# Patient Record
Sex: Female | Born: 1950 | Race: White | Hispanic: No | Marital: Married | Smoking: Former smoker
Health system: Southern US, Community
[De-identification: ages and names within clinical notes are randomized; demographics above are authoritative.]

## PROBLEM LIST (undated history)

## (undated) DIAGNOSIS — E785 Hyperlipidemia, unspecified: Secondary | ICD-10-CM

## (undated) DIAGNOSIS — E669 Obesity, unspecified: Secondary | ICD-10-CM

## (undated) DIAGNOSIS — Z720 Tobacco use: Secondary | ICD-10-CM

## (undated) DIAGNOSIS — I10 Essential (primary) hypertension: Secondary | ICD-10-CM

## (undated) DIAGNOSIS — R739 Hyperglycemia, unspecified: Secondary | ICD-10-CM

## (undated) DIAGNOSIS — I2510 Atherosclerotic heart disease of native coronary artery without angina pectoris: Secondary | ICD-10-CM

## (undated) DIAGNOSIS — I2111 ST elevation (STEMI) myocardial infarction involving right coronary artery: Secondary | ICD-10-CM

## (undated) HISTORY — DX: Tobacco use: Z72.0

## (undated) HISTORY — DX: Obesity, unspecified: E66.9

## (undated) HISTORY — DX: Hyperlipidemia, unspecified: E78.5

## (undated) HISTORY — DX: Hyperglycemia, unspecified: R73.9

## (undated) HISTORY — PX: ABDOMINAL HYSTERECTOMY: SHX81

## (undated) HISTORY — PX: HEMORROIDECTOMY: SUR656

## (undated) HISTORY — DX: Essential (primary) hypertension: I10

## (undated) HISTORY — DX: Atherosclerotic heart disease of native coronary artery without angina pectoris: I25.10

---

## 2003-06-21 ENCOUNTER — Emergency Department (HOSPITAL_COMMUNITY): Admission: AD | Admit: 2003-06-21 | Discharge: 2003-06-21 | Payer: Self-pay | Admitting: Family Medicine

## 2003-11-19 ENCOUNTER — Emergency Department (HOSPITAL_COMMUNITY): Admission: EM | Admit: 2003-11-19 | Discharge: 2003-11-19 | Payer: Self-pay | Admitting: *Deleted

## 2004-04-16 ENCOUNTER — Emergency Department (HOSPITAL_COMMUNITY): Admission: EM | Admit: 2004-04-16 | Discharge: 2004-04-16 | Payer: Self-pay | Admitting: *Deleted

## 2004-05-29 ENCOUNTER — Emergency Department (HOSPITAL_COMMUNITY): Admission: EM | Admit: 2004-05-29 | Discharge: 2004-05-29 | Payer: Self-pay | Admitting: Emergency Medicine

## 2007-08-14 ENCOUNTER — Emergency Department (HOSPITAL_COMMUNITY): Admission: EM | Admit: 2007-08-14 | Discharge: 2007-08-14 | Payer: Self-pay | Admitting: Family Medicine

## 2007-08-18 ENCOUNTER — Inpatient Hospital Stay (HOSPITAL_COMMUNITY): Admission: EM | Admit: 2007-08-18 | Discharge: 2007-08-20 | Payer: Self-pay | Admitting: Emergency Medicine

## 2011-01-19 NOTE — H&P (Signed)
Leslie Dorsey               ACCOUNT NO.:  [id number]   MEDICAL RECORD NO.:  [id number]          PATIENT TYPE:  INP   LOCATION:  6712                         FACILITY:  [facility]   PHYSICIAN:  Mobolaji B. Bakare, M.D.DATE OF BIRTH:  Sep 17, 1950   DATE OF ADMISSION:  08/18/2007  DATE OF DISCHARGE:  08/20/2007                              HISTORY & PHYSICAL   PRIMARY CARE PHYSICIAN:  Unassigned.   CHIEF COMPLAINT:  Headaches and elevated blood pressure.   HISTORY OF PRESENTING COMPLAINT:  Leslie Dorsey is a 60 year old Caucasian  female with no known significant past medical history.  She is not on  any chronic medications.  She started having headaches about 3 days ago,  which were intermittent and hemicrania.  It alternates between left and  right side.  Currently, the headache is on the right from her forehead  back to her occipital region with pain behind her eyes and she rates the  pain in severity as a 14/10.  She described it like a distant thunder.  It is aggravated by movements, lights, and sounds.  When she gets this  headache, she does not want to do anything.  She just wants to lay  still.  Last night, the patient could not bear the headache, she had to  lay still on the floor in the bathroom where her husband found her.  It  has been associated with nausea and dry heaving but no vomiting.  She  did not describe any aura.  The patient states that she has been told in  the distant past that she had migraine headaches, but she is not on any  regular treatment for this.  The patient was seen at the urgent care on August 14, 2007, for right  hip pain which was felt to be arthritis.  She was prescribed some pain  medication.  This symptom has subsided.  During that visit, she had a  blood pressure checked.  It was 197/102.  She was instructed to follow  up with a recheck in a week.  The patient went to her eye doctor today  to get her eyeglasses.  She requested to have her  blood pressure checked  and it was 197/117.  Then she was told to go to the urgent care.  She  was seen at [facility].  Her blood pressure was 205/122.  The patient was  then sent to the hospital.  She has no known history of hypertension.  Currently, the patient's blood pressure is 156/96, after treatment with  clonidine 0.1 mg x2 doses.  She has also received 4 mg of morphine IV  and 4 mg of Zofran.  The patient describes diarrhea episodes this morning.  She had 2  episodes of diarrheal stool associated with some abdominal discomfort  but no vomiting, no hematochezia.  This has since subsided.  She denies  fever or chills.  She has no dysuria or urgency.   REVIEW OF SYSTEMS:  She denies chest pain, shortness of breath,  abdominal pain, no fever or chills, no cough, no orthopnea or PND.   PAST  MEDICAL HISTORY:  History of migraine headaches.   PAST SURGICAL HISTORY:  1. Hysterectomy.  2. Hemorrhoidectomy.   FAMILY HISTORY:  Significant for diabetes mellitus, hypertension, heart  disease in her Mom.  Father comitted suicide.  The patient has other  siblings, nine in total.   MEDICATIONS:  None.   ALLERGIES:  CODEINE causes headache.   SOCIAL HISTORY:  The patient works as a Conservation officer, nature in [employer].  She  smokes cigarettes and she has been smoking an average of 1 pack per day  since the age of 9.  She is keen on quitting smoking.  She denies  alcohol use/abuse and illicit drug abuse.   PHYSICAL EXAMINATION:  VITAL SIGNS:  Initial temperature 97.9, blood  pressure 197/106, pulse 75, respiratory rate of 20, O2 sat of 97%.  Her  current blood pressure is 156/96 with a heart rate of 76.  GENERAL:  The patient is comfortable.  She has her eyes closed most of  the time.  She is not in acute respiratory distress.  HEENT:  Normocephalic atraumatic head.  I did not evaluate the pupils in  view of sensitivity to light.  Mucous membranes moist.  No oral thrush.  NECK:  No elevated JVD.  No  thyromegaly, carotid bruit.  There is no  neck stiffness.  LUNGS:  Left basal rales.  CV:  S1 S2 regular.  No murmur.  ABDOMEN:  Not distended.  Soft.  There is mild epigastric tenderness  without rebound or guarding.  There is no tenderness in the lower  abdomen.  Bowel sounds present.  No palpable organomegaly.  EXTREMITIES:  No pedal edema or calf tenderness. Dorsalis pedis pulses  are 2 plus bilaterally.  CNS:  Muscle power 5/5 in all limbs.  Coordination is intact, although  the patient could not properly open her eyes but there was no past-  pointing.   INITIAL LABORATORY DATA:   Dictation ended at this point.      Mobolaji B. Corky Downs, M.D.  Electronically Signed     MBB/MEDQ  D:  08/18/2007  T:  08/20/2007  Job:  [id number]

## 2011-01-19 NOTE — H&P (Signed)
NAME:  Leslie Dorsey, Leslie Dorsey NO.:  [id number]   MEDICAL RECORD NO.:  [id number]          PATIENT TYPE:  EMS   LOCATION:  MAJO                         FACILITY:  [facility]   PHYSICIAN:  Mobolaji B. Bakare, M.D.DATE OF BIRTH:  1950-12-18   DATE OF ADMISSION:  08/18/2007  DATE OF DISCHARGE:                              HISTORY & PHYSICAL   This is a continuation of dictation on this patient that was  interrupted.   LABORATORY DATA:  Creatinine 0.8.  Venous pH 7.32, venous pCO2 55.5.  Sodium 139, potassium 3.4, chloride 106, glucose 85, BUN 12.  White  cells 9.3, hemoglobin 14.5, hematocrit 42.5, platelets 359.  Normal  differential.  Urinalysis:  Cloudy in appearance with specific gravity  of 1.028, ketones 15, small leukocytes.  Microscopy, white cells 0-2,  amorphous urates and granular casts.   EKG shows a normal sinus rhythm without ST segment elevation.  No LVH on  EKG.  Normal intervals.  Heart rate of 62.   Chest x-ray shows no acute cardiopulmonary disease.   Head CT scan showed no intracranial abnormality.   ASSESSMENT/PLAN:  Leslie Dorsey is a 60 year old Caucasian female  presenting with headaches and hypertension.  She has a history of  migraine headache in the remote past.  She described these headaches,  being precipitated sometimes by emotional stress.   ADMISSION DIAGNOSES:  1. Migraine headaches:  Will start on Vicodin 1-2 p.o. q.4h. p.r.n.      Metoclopramide (Reglan) 10 mg IV q.6h. around the clock for 24      hours, then p.r.n.  Will avoid trypotan in this setting with      uncontrolled hypertension.  Will give Dilaudid 0.5 to 1 mg IV q.4h.      p.r.n. severe pain.  2. Hypertension:  Seemingly, this is a new diagnosis for the patient,      although she has had pain associated with high blood pressure;      however, I believe she would benefit from starting antihypertensive      and pain solely would not account for the degree of high blood  pressure.  Will start on beta blocker, atenolol 25 mg p.o. daily,      and add hydrochlorothiazide 12.5 mg p.o. daily.  Would give      labetalol  10 mg IV q.4h. p.r.n. for diastolic blood pressure      greater than 105 and systolic greater than 200.  Will check a      fasting lipid profile and hemoglobin A1C.  She will be placed on a      low salt diet.  3. Tobacco abuse:  Patient is quitting smoking.  Will offer tobacco      cessation counseling and place on nicotine patch 21 mg daily.  4. Diarrhea:  This may be sympathetic-derived.  Nevertheless, will      send stool for culture, C. diff, and fecal leukocytes.  Will      continue IV normal saline at 100 cc/hr.  5. Hypokalemia:  Will replete with potassium chloride 40 mEq x1 and  also replete IV fluids.      Mobolaji B. Corky Downs, M.D.  Electronically Signed     MBB/MEDQ  D:  08/18/2007  T:  08/18/2007  Job:  [id number]

## 2011-01-19 NOTE — Discharge Summary (Signed)
NAMEJUNI, GLAAB               ACCOUNT NO.:  [id number]   MEDICAL RECORD NO.:  [id number]          PATIENT TYPE:  INP   LOCATION:  6712                         FACILITY:  [facility]   PHYSICIAN:  Mobolaji B. Bakare, M.D.DATE OF BIRTH:  [birth year]   DATE OF ADMISSION:  08/18/2007  DATE OF DISCHARGE:  08/20/2007                               DISCHARGE SUMMARY   PRIMARY CARE PHYSICIAN:  [facility].   FINAL DIAGNOSES:  1. Migraine headaches.  2. Tobacco abuse.  3. Uncontrolled hypertension.  4. Transient diarrhea, resolved.  5. Hypercholesterolemia.   PROCEDURES:  1. Ultrasound of the abdomen was normal.  There was no pancreatitis.  2. Head CT scan showed no acute intracranial abnormality.  3. Chest x-ray shows no acute cardiopulmonary process.   BRIEF HISTORY:  Please refer to the admission H&P dictated by Dr.  Corky Downs.  In brief, Mrs. Saville presented with unilateral hemicranial  headaches associated with nausea, aggravated by noise, activities, and  lights.  She had uncontrolled blood pressure at the time of admission,  with systolic in the range of 200.  She responded to IV labetalol in the  emergency room.  The patient has a history of migraine headaches  previously, and recently in the last 2 weeks she has had high blood  pressure, but she did not seek medical attention for treatment.  She was  admitted for further treatment and evaluation.   HOSPITAL COURSE:  1. Migraine headaches.  She was started on metoclopramide for nausea      and Vicodin for mild to moderate headaches.  She responded well to      this treatment.  The patient was given a prescription for Midrin to      use p.r.n. at onset of migraine headaches.  2. Hypertension.  Blood pressure was controlled prior to discharge      with atenolol 25 mg daily.  Moreso, as the patient has migraine      headaches, this can be used as a preventive medication.  Blood      pressure at the time of discharge was  128/82.  She was on a      combination of atenolol at hydrochlorothiazide.  3. Tobacco abuse.  She was using 1 pack per day, and she has been      smoking since the age of 17.  The patient was counseled on tobacco      cessation, and she is willing to quit.  She will be required to      have outpatient tobacco cessation counseling and continue nicotine      patch at home.  4. Hypercholesterolemia.  The patient had a fasting lipid profile with      results showing normal HDL of 45, total cholesterol 235, LDL      cholesterol was 63, triglycerides 94.  She was advised on low-      cholesterol diet, and she should have a follow-up fasting lipid      profile in 3 months.  5. Diarrhea.  The patient had a transient diarrhea episode on the day  of admission.  There was no recurrence during this hospitalization.      A lipase was checked, which was initially abnormal at 364, but the      patient did not have any significant abdominal pain.  Follow-up      lipase the next day was 37, with amylase of 77.  Ultrasound of the      abdomen was not suggestive of pancreatitis, and there was no      abnormality on abdominal ultrasound.   DISCHARGE MEDICATIONS:  1. Atenolol 25 mg daily.  2. Hydrochlorothiazide 12.5 mg daily.  3. Nicotine patch 21 mg daily.  4. Midrin to use p.r.n.   RECOMMENDATIONS:  Repeat fasting lipid profile in 3 months.   DIET:  Low-cholesterol diet, low-salt diet.   DISCHARGE LABORATORY DATA:  White cells 8.1, hemoglobin 12.2, hematocrit  36, platelets 288.  Sodium 139, potassium 3.6, chloride 103, CO2 27,  glucose 78, BUN 11, creatinine 0.81.  AST 16, ALT 13, total protein 5.8,  albumin 3.3, TSH 1.979.  Hemoglobin A1c 5.7.  Fasting lipid profile as  mentioned in the HPI.  Magnesium 2.      Mobolaji B. Corky Downs, M.D.  Electronically Signed     MBB/MEDQ  D:  08/20/2007  T:  08/21/2007  Job:  [id number]

## 2011-06-14 LAB — COMPREHENSIVE METABOLIC PANEL
ALT: 13
Alkaline Phosphatase: 73
CO2: 27
Chloride: 103
GFR calc non Af Amer: >60
Glucose, Bld: 78
Potassium: 3.6
Sodium: 139
Total Bilirubin: 0.9

## 2011-06-14 LAB — CBC
HCT: 36
HCT: 42.5
Hemoglobin: 12.2
Hemoglobin: 14.4
MCHC: 33.8
MCHC: 33.9
MCV: 91.8
MCV: 91.8
Platelets: 288
Platelets: 359
RBC: 3.92
RBC: 4.63
RDW: 12
RDW: 12.4
WBC: 8.1
WBC: 9.3

## 2011-06-14 LAB — LIPID PANEL
Cholesterol: 231 — ABNORMAL HIGH
LDL Cholesterol: 167 — ABNORMAL HIGH
Triglycerides: 94

## 2011-06-14 LAB — BASIC METABOLIC PANEL
BUN: 11
CO2: 25
Calcium: 8.8
Chloride: 108
Creatinine, Ser: 0.78
GFR calc Af Amer: >60
GFR calc non Af Amer: >60
Glucose, Bld: 93
Potassium: 4.2
Sodium: 138

## 2011-06-14 LAB — HEPATIC FUNCTION PANEL
ALT: 14
AST: 15
Albumin: 3.5
Alkaline Phosphatase: 71
Bilirubin, Direct: <0.1
Total Bilirubin: 0.8
Total Protein: 5.8 — ABNORMAL LOW

## 2011-06-14 LAB — DIFFERENTIAL
Basophils Absolute: 0.1
Basophils Relative: 1
Eosinophils Absolute: 0.1 — ABNORMAL LOW
Eosinophils Relative: 1
Lymphocytes Relative: 31
Lymphs Abs: 2.8
Monocytes Absolute: 0.5
Monocytes Relative: 5
Neutro Abs: 5.8
Neutrophils Relative %: 62

## 2011-06-14 LAB — URINALYSIS, ROUTINE W REFLEX MICROSCOPIC
Glucose, UA: NEGATIVE
Hgb urine dipstick: NEGATIVE
Ketones, ur: 15 — AB
Nitrite: NEGATIVE
Protein, ur: NEGATIVE
Specific Gravity, Urine: 1.028
Urobilinogen, UA: 0.2
pH: 5

## 2011-06-14 LAB — URINE MICROSCOPIC-ADD ON

## 2011-06-14 LAB — LIPASE, BLOOD
Lipase: 364 — ABNORMAL HIGH
Lipase: 37

## 2011-06-14 LAB — HEMOGLOBIN A1C: Hgb A1c MFr Bld: 5.7

## 2011-06-14 LAB — AMYLASE: Amylase: 77

## 2011-06-14 LAB — I-STAT 8, (EC8 V) (CONVERTED LAB)
Acid-Base Excess: 1
BUN: 12
Bicarbonate: 28.7 — ABNORMAL HIGH
Chloride: 106
Glucose, Bld: 85
HCT: 46
Hemoglobin: 15.6 — ABNORMAL HIGH
Operator id: 284141
Potassium: 3.4 — ABNORMAL LOW
Sodium: 139
TCO2: 30
pCO2, Ven: 55.5 — ABNORMAL HIGH
pH, Ven: 7.321 — ABNORMAL HIGH

## 2011-06-14 LAB — POCT CARDIAC MARKERS
CKMB, poc: <1 — ABNORMAL LOW
Troponin i, poc: <0.05

## 2011-06-14 LAB — TSH: TSH: 1.979

## 2011-06-14 LAB — PHOSPHORUS: Phosphorus: 3.8

## 2014-03-14 ENCOUNTER — Emergency Department (HOSPITAL_BASED_OUTPATIENT_CLINIC_OR_DEPARTMENT_OTHER): Payer: 59

## 2014-03-14 ENCOUNTER — Encounter (HOSPITAL_BASED_OUTPATIENT_CLINIC_OR_DEPARTMENT_OTHER): Payer: Self-pay | Admitting: Emergency Medicine

## 2014-03-14 ENCOUNTER — Emergency Department (HOSPITAL_BASED_OUTPATIENT_CLINIC_OR_DEPARTMENT_OTHER)
Admission: EM | Admit: 2014-03-14 | Discharge: 2014-03-14 | Disposition: A | Discharge status: Home / Self Care | Payer: 59 | Attending: Emergency Medicine | Admitting: Emergency Medicine

## 2014-03-14 DIAGNOSIS — I10 Essential (primary) hypertension: Secondary | ICD-10-CM | POA: Insufficient documentation

## 2014-03-14 DIAGNOSIS — R0602 Shortness of breath: Secondary | ICD-10-CM | POA: Insufficient documentation

## 2014-03-14 DIAGNOSIS — F17200 Nicotine dependence, unspecified, uncomplicated: Secondary | ICD-10-CM | POA: Insufficient documentation

## 2014-03-14 LAB — BASIC METABOLIC PANEL
ANION GAP: 12 (ref 5–15)
BUN: 14 mg/dL (ref 6–23)
CALCIUM: 9.4 mg/dL (ref 8.4–10.5)
CHLORIDE: 103 meq/L (ref 96–112)
CO2: 28 meq/L (ref 19–32)
CREATININE: 0.8 mg/dL (ref 0.50–1.10)
GFR calc Af Amer: 88 mL/min — ABNORMAL LOW (ref >90)
GFR calc non Af Amer: 76 mL/min — ABNORMAL LOW (ref >90)
GLUCOSE: 97 mg/dL (ref 70–99)
Potassium: 3.4 mEq/L — ABNORMAL LOW (ref 3.7–5.3)
Sodium: 143 mEq/L (ref 137–147)

## 2014-03-14 LAB — CBC
HCT: 39.9 % (ref 36.0–46.0)
HEMOGLOBIN: 13.3 g/dL (ref 12.0–15.0)
MCH: 30.9 pg (ref 26.0–34.0)
MCHC: 33.3 g/dL (ref 30.0–36.0)
MCV: 92.6 fL (ref 78.0–100.0)
Platelets: 297 10*3/uL (ref 150–400)
RBC: 4.31 MIL/uL (ref 3.87–5.11)
RDW: 12.8 % (ref 11.5–15.5)
WBC: 7.4 10*3/uL (ref 4.0–10.5)

## 2014-03-14 LAB — TROPONIN I

## 2014-03-14 LAB — PRO B NATRIURETIC PEPTIDE: Pro B Natriuretic peptide (BNP): 87.6 pg/mL (ref 0–125)

## 2014-03-14 MED ORDER — LISINOPRIL 10 MG PO TABS: 10.0000 mg | ORAL_TABLET | Freq: Every day | ORAL | Status: DC

## 2014-03-14 MED ORDER — LABETALOL HCL 5 MG/ML IV SOLN
20.0000 mg | Freq: Once | INTRAVENOUS | Status: AC
  Administered 2014-03-14: 20 mg via INTRAVENOUS
  Filled 2014-03-14: qty 4

## 2014-03-14 NOTE — ED Provider Notes (Signed)
CSN: [id number]     Arrival date & time 03/14/14  1238 History   First MD Initiated Contact with Patient 03/14/14 1343     Chief Complaint  Patient presents with  . Hypertension     (Consider location/radiation/quality/duration/timing/severity/associated sxs/prior Treatment) HPI Comments: Patient presents to the ER for evaluation of elevated blood pressure. Patient went to the eye doctor today and was told her blood pressure was very elevated. She was 190s over 100 100s. Patient does endorse frequent headaches over the last month. She reports that she often wakes up with a headache, but it completely goes away after she takes a BC powder.   Patient denies chest pain. She does report that she has been noticing some decreased exercise tolerance, gets out of breath if she walks a distance, but does not get chest pain with exertion.  Patient is a 63 y.o. female presenting with hypertension.  Hypertension Associated symptoms include headaches and shortness of breath. Pertinent negatives include no chest pain.    No past medical history on file. Past Surgical History  Procedure Laterality Date  . Abdominal hysterectomy    . Hemorroidectomy     No family history on file. History  Substance Use Topics  . Smoking status: Current Every Day Smoker  . Smokeless tobacco: Not on file  . Alcohol Use: Not on file   OB History   Grav Para Term Preterm Abortions TAB SAB Ect Mult Living                 Review of Systems  Respiratory: Positive for shortness of breath.   Cardiovascular: Negative for chest pain.  Neurological: Positive for headaches.  All other systems reviewed and are negative.     Allergies  Morphine and related  Home Medications   Prior to Admission medications   Not on File   BP 191/84  Pulse 74  Resp 18  SpO2 98% Physical Exam  Constitutional: She is oriented to person, place, and time. She appears well-developed and well-nourished. No distress.  HENT:   Head: Normocephalic and atraumatic.  Right Ear: Hearing normal.  Left Ear: Hearing normal.  Nose: Nose normal.  Mouth/Throat: Oropharynx is clear and moist and mucous membranes are normal.  Eyes: Conjunctivae and EOM are normal. Pupils are equal, round, and reactive to light.  Neck: Normal range of motion. Neck supple.  Cardiovascular: Regular rhythm, S1 normal and S2 normal.  Exam reveals no gallop and no friction rub.   No murmur heard. Pulmonary/Chest: Effort normal and breath sounds normal. No respiratory distress. She exhibits no tenderness.  Abdominal: Soft. Normal appearance and bowel sounds are normal. There is no hepatosplenomegaly. There is no tenderness. There is no rebound, no guarding, no tenderness at McBurney's point and negative Murphy's sign. No hernia.  Musculoskeletal: Normal range of motion.  Neurological: She is alert and oriented to person, place, and time. She has normal strength. No cranial nerve deficit or sensory deficit. Coordination normal. GCS eye subscore is 4. GCS verbal subscore is 5. GCS motor subscore is 6.  Skin: Skin is warm, dry and intact. No rash noted. No cyanosis.  Psychiatric: She has a normal mood and affect. Her speech is normal and behavior is normal. Thought content normal.    ED Course  Procedures (including critical care time) Labs Review Labs Reviewed  BASIC METABOLIC PANEL - Abnormal; Notable for the following:    Potassium 3.4 (*)    GFR calc non Af Amer 76 (*)  GFR calc Af Amer 88 (*)    All other components within normal limits  CBC  TROPONIN I  PRO B NATRIURETIC PEPTIDE    Imaging Review Dg Chest 2 View  03/14/2014   CLINICAL DATA:  Short of breath  EXAM: CHEST  2 VIEW  COMPARISON:  Radiograph 08/18/2007  FINDINGS: Normal mediastinum and cardiac silhouette. Chronic central bronchitic markings. Normal pulmonary vasculature. No effusion, infiltrate, or pneumothorax.  IMPRESSION: Chronic bronchitic markings.  No acute findings.    Electronically Signed   By: Genevive Bi M.D.   On: 03/14/2014 14:46     EKG Interpretation   Date/Time:  Thursday March 14 2014 14:12:03 EDT Ventricular Rate:  74 PR Interval:  144 QRS Duration: 84 QT Interval:  396 QTC Calculation: 439 R Axis:   47 Text Interpretation:  Normal sinus rhythm Nonspecific ST and T wave  abnormality Abnormal ECG No significant change since last tracing  Confirmed by POLLINA  MD, CHRISTOPHER [phone]) on 03/14/2014 2:48:48 PM      MDM   Final diagnoses:  None   hypertension  Patient presents to the ER for evaluation of elevated blood pressure. Patient is essentially asymptomatic. She has been experiencing headaches for a month, but they go away with over-the-counter analgesics and headache is not currently present. She has a normal neurologic exam.  Basic workup was unremarkable. Included a cardiac workup because she reports that she has some shortness of breath with exertion at times. She has no evidence of congestive heart failure. EKG unremarkable. Troponin negative. Patient was hypertensive here, she was administered labetalol with some mild improvement. Patient will be initiated on lisinopril. She reports that she has a doctor at [facility] that she can call for followup.    Gilda Crease, MD 03/14/14 [phone]

## 2014-03-14 NOTE — Discharge Instructions (Signed)
Hypertension Hypertension, commonly called high blood pressure, is when the force of blood pumping through your arteries is too strong. Your arteries are the blood vessels that carry blood from your heart throughout your body. A blood pressure reading consists of a higher number over a lower number, such as 110/72. The higher number (systolic) is the pressure inside your arteries when your heart pumps. The lower number (diastolic) is the pressure inside your arteries when your heart relaxes. Ideally you want your blood pressure below 120/80. Hypertension forces your heart to work harder to pump blood. Your arteries may become narrow or stiff. Having hypertension puts you at risk for heart disease, stroke, and other problems.  RISK FACTORS Some risk factors for high blood pressure are controllable. Others are not.  Risk factors you cannot control include:   Race. You may be at higher risk if you are African American.  Age. Risk increases with age.  Gender. Men are at higher risk than women before age 45 years. After age 65, women are at higher risk than men. Risk factors you can control include:  Not getting enough exercise or physical activity.  Being overweight.  Getting too much fat, sugar, calories, or salt in your diet.  Drinking too much alcohol. SIGNS AND SYMPTOMS Hypertension does not usually cause signs or symptoms. Extremely high blood pressure (hypertensive crisis) may cause headache, anxiety, shortness of breath, and nosebleed. DIAGNOSIS  To check if you have hypertension, your health care provider will measure your blood pressure while you are seated, with your arm held at the level of your heart. It should be measured at least twice using the same arm. Certain conditions can cause a difference in blood pressure between your right and left arms. A blood pressure reading that is higher than normal on one occasion does not mean that you need treatment. If one blood pressure reading  is high, ask your health care provider about having it checked again. TREATMENT  Treating high blood pressure includes making lifestyle changes and possibly taking medication. Living a healthy lifestyle can help lower high blood pressure. You may need to change some of your habits. Lifestyle changes may include:  Following the DASH diet. This diet is high in fruits, vegetables, and whole grains. It is low in salt, red meat, and added sugars.  Getting at least 2 1/2 hours of brisk physical activity every week.  Losing weight if necessary.  Not smoking.  Limiting alcoholic beverages.  Learning ways to reduce stress. If lifestyle changes are not enough to get your blood pressure under control, your health care provider may prescribe medicine. You may need to take more than one. Work closely with your health care provider to understand the risks and benefits. HOME CARE INSTRUCTIONS  Have your blood pressure rechecked as directed by your health care provider.   Only take medicine as directed by your health care provider. Follow the directions carefully. Blood pressure medicines must be taken as prescribed. The medicine does not work as well when you skip doses. Skipping doses also puts you at risk for problems.   Do not smoke.   Monitor your blood pressure at home as directed by your health care provider. SEEK MEDICAL CARE IF:   You think you are having a reaction to medicines taken.  You have recurrent headaches or feel dizzy.  You have swelling in your ankles.  You have trouble with your vision. SEEK IMMEDIATE MEDICAL CARE IF:  You develop a severe headache or   confusion.  You have unusual weakness, numbness, or feel faint.  You have severe chest or abdominal pain.  You vomit repeatedly.  You have trouble breathing. MAKE SURE YOU:   Understand these instructions.  Will watch your condition.  Will get help right away if you are not doing well or get  worse. Document Released: 08/23/2005 Document Revised: 08/28/2013 Document Reviewed: 06/15/2013 Exit[employer] Patient Information 2015 [employer]. This information is not intended to replace advice given to you by your health care provider. Make sure you discuss any questions you have with your health care provider.  

## 2014-03-14 NOTE — ED Notes (Signed)
Pt was seen at eye doctor.  She was told to get her BP checked.  Some headaches for one month.

## 2014-08-06 ENCOUNTER — Emergency Department (HOSPITAL_BASED_OUTPATIENT_CLINIC_OR_DEPARTMENT_OTHER)
Admission: EM | Admit: 2014-08-06 | Discharge: 2014-08-06 | Discharge status: Against Medical Advice | Payer: 59 | Attending: Emergency Medicine | Admitting: Emergency Medicine

## 2014-08-06 ENCOUNTER — Encounter (HOSPITAL_BASED_OUTPATIENT_CLINIC_OR_DEPARTMENT_OTHER): Payer: Self-pay | Admitting: Emergency Medicine

## 2014-08-06 DIAGNOSIS — Z79899 Other long term (current) drug therapy: Secondary | ICD-10-CM | POA: Diagnosis not present

## 2014-08-06 DIAGNOSIS — T7840XA Allergy, unspecified, initial encounter: Secondary | ICD-10-CM

## 2014-08-06 DIAGNOSIS — Z720 Tobacco use: Secondary | ICD-10-CM | POA: Insufficient documentation

## 2014-08-06 DIAGNOSIS — L500 Allergic urticaria: Secondary | ICD-10-CM | POA: Diagnosis not present

## 2014-08-06 DIAGNOSIS — R05 Cough: Secondary | ICD-10-CM | POA: Diagnosis not present

## 2014-08-06 DIAGNOSIS — I10 Essential (primary) hypertension: Secondary | ICD-10-CM | POA: Diagnosis not present

## 2014-08-06 DIAGNOSIS — T368X5A Adverse effect of other systemic antibiotics, initial encounter: Secondary | ICD-10-CM | POA: Diagnosis not present

## 2014-08-06 DIAGNOSIS — R21 Rash and other nonspecific skin eruption: Secondary | ICD-10-CM | POA: Diagnosis present

## 2014-08-06 MED ORDER — FAMOTIDINE 20 MG PO TABS
20.0000 mg | ORAL_TABLET | Freq: Once | ORAL | Status: AC
  Administered 2014-08-06: 20 mg via ORAL
  Filled 2014-08-06: qty 1

## 2014-08-06 MED ORDER — DIPHENHYDRAMINE HCL 50 MG/ML IJ SOLN
25.0000 mg | Freq: Once | INTRAMUSCULAR | Status: AC
  Administered 2014-08-06: 25 mg via INTRAMUSCULAR
  Filled 2014-08-06: qty 1

## 2014-08-06 MED ORDER — PREDNISONE 50 MG PO TABS
60.0000 mg | ORAL_TABLET | Freq: Once | ORAL | Status: AC
  Administered 2014-08-06: 60 mg via ORAL
  Filled 2014-08-06 (×2): qty 1

## 2014-08-06 NOTE — ED Notes (Signed)
Pt having possible reaction to Levofloxacin.  Pt having numbness in mouth, and some hives with itching and pain.

## 2014-08-06 NOTE — ED Notes (Signed)
Pt states she wants to go home. Pt wants to walk around nurses station, "can't stay in room". Instructed pt she needs to stay in room d/t other pts privacy. Pt states to tell MD she wants to go home.

## 2014-08-06 NOTE — ED Notes (Signed)
Pt left in no distress per Mick SellKaren P. RN.

## 2014-08-06 NOTE — ED Notes (Signed)
Pt observed leaving without MD doing final discharge instructions.

## 2014-08-06 NOTE — ED Provider Notes (Signed)
CSN: [id number]     Arrival date & time 08/06/14  0911 History   First MD Initiated Contact with Patient 08/06/14 1005     Chief Complaint  Patient presents with  . Allergic Reaction     (Consider location/radiation/quality/duration/timing/severity/associated sxs/prior Treatment) Patient is a 63 y.o. female presenting with allergic reaction.  Allergic Reaction Presenting symptoms: itching and rash   Rash:    Location:  Full body   Quality: itchiness and painful     Onset quality:  Gradual   Duration:  12 hours   Timing:  Constant   Progression:  Worsening Severity:  Severe Prior allergic episodes:  No prior episodes Context comment:  Taking levofloxacin Relieved by:  Nothing Exacerbated by: scratching. Ineffective treatments: children's benadryl.   Past Medical History  Diagnosis Date  . Hypertension    Past Surgical History  Procedure Laterality Date  . Abdominal hysterectomy    . Hemorroidectomy     No family history on file. History  Substance Use Topics  . Smoking status: Current Every Day Smoker  . Smokeless tobacco: Not on file  . Alcohol Use: Not on file   OB History    No data available     Review of Systems  Respiratory: Positive for cough.   Skin: Positive for itching and rash.  All other systems reviewed and are negative.     Allergies  Morphine and related and Levofloxacin  Home Medications   Prior to Admission medications   Medication Sig Start Date End Date Taking? Authorizing Provider  amLODipine (NORVASC) 5 MG tablet Take 5 mg by mouth daily.   Yes Historical Provider, MD  losartan (COZAAR) 50 MG tablet Take 50 mg by mouth daily.   Yes Historical Provider, MD   BP 180/118 mmHg  Pulse 85  Temp(Src) 98.2 F (36.8 C) (Oral)  Resp 16  Wt 184 lb (83.462 kg)  SpO2 99% Physical Exam  Constitutional: She is oriented to person, place, and time. She appears well-developed and well-nourished. No distress.  Pacing room, trying not to  scratch skin  HENT:  Head: Normocephalic and atraumatic.  Mouth/Throat: Oropharynx is clear and moist and mucous membranes are normal. No posterior oropharyngeal edema.  No angioedema  Eyes: Conjunctivae are normal. Pupils are equal, round, and reactive to light. No scleral icterus.  Neck: Neck supple.  Cardiovascular: Normal rate, regular rhythm, normal heart sounds and intact distal pulses.   No murmur heard. Pulmonary/Chest: Effort normal and breath sounds normal. No stridor. No respiratory distress. She has no wheezes. She has no rales.  Abdominal: Soft. Bowel sounds are normal. She exhibits no distension. There is no tenderness.  Musculoskeletal: Normal range of motion.  Neurological: She is alert and oriented to person, place, and time.  Skin: Skin is warm and dry. Rash noted. Rash is urticarial (mild, scattered).  Psychiatric: She has a normal mood and affect. Her behavior is normal.  Nursing note and vitals reviewed.   ED Course  Procedures (including critical care time) Labs Review Labs Reviewed - No data to display  Imaging Review No results found.   EKG Interpretation None      MDM   Final diagnoses:  Allergic reaction, initial encounter    63 year old female with allergic reaction symptoms in the context of taking levofloxacin.  Symptoms appear to be limited to skin hives and itchiness. No syncope, wheezing, abdominal pain. Plan treatment with Benadryl, prednisone, Pepcid.  Pt eloped from the ED prior to re-evaluation (including blood pressure  recheck).  She did not have evidence of anaphylaxis.  During our initial conversation, I told her she would need scheduled benadryl for several days.   Warnell Foresterrey Fin Hupp, MD 08/06/14 [phone]

## 2014-08-06 NOTE — ED Notes (Signed)
Pt states rash seems worse after meds given. Rash noted above umbilicus. No resp difficulty. Dr. Loretha StaplerWofford aware.

## 2014-08-13 ENCOUNTER — Ambulatory Visit
Admission: RE | Admit: 2014-08-13 | Discharge: 2014-08-13 | Disposition: A | Discharge status: Home / Self Care | Payer: 59 | Source: Ambulatory Visit | Attending: Family Medicine | Admitting: Family Medicine

## 2014-08-13 ENCOUNTER — Other Ambulatory Visit: Payer: Self-pay | Admitting: Family Medicine

## 2014-08-13 DIAGNOSIS — R05 Cough: Secondary | ICD-10-CM

## 2014-08-13 DIAGNOSIS — R059 Cough, unspecified: Secondary | ICD-10-CM

## 2015-01-14 ENCOUNTER — Ambulatory Visit
Admission: RE | Admit: 2015-01-14 | Discharge: 2015-01-14 | Disposition: A | Discharge status: Home / Self Care | Payer: 59 | Source: Ambulatory Visit | Attending: Family Medicine | Admitting: Family Medicine

## 2015-01-14 ENCOUNTER — Other Ambulatory Visit: Payer: Self-pay | Admitting: Family Medicine

## 2015-01-14 DIAGNOSIS — R0781 Pleurodynia: Secondary | ICD-10-CM

## 2015-08-26 ENCOUNTER — Inpatient Hospital Stay (HOSPITAL_COMMUNITY): Payer: 59

## 2015-08-26 ENCOUNTER — Inpatient Hospital Stay (HOSPITAL_COMMUNITY)
Admission: RE | Admit: 2015-08-26 | Discharge: 2015-08-28 | DRG: 247 | Disposition: A | Discharge status: Home / Self Care | Payer: 59 | Source: Ambulatory Visit | Attending: Cardiovascular Disease | Admitting: Cardiovascular Disease

## 2015-08-26 ENCOUNTER — Encounter (HOSPITAL_COMMUNITY): Payer: Self-pay | Admitting: Cardiology

## 2015-08-26 ENCOUNTER — Encounter (HOSPITAL_COMMUNITY)
Admission: RE | Disposition: A | Discharge status: Home / Self Care | Payer: 59 | Source: Ambulatory Visit | Attending: Cardiovascular Disease

## 2015-08-26 DIAGNOSIS — E785 Hyperlipidemia, unspecified: Secondary | ICD-10-CM | POA: Diagnosis present

## 2015-08-26 DIAGNOSIS — R0789 Other chest pain: Secondary | ICD-10-CM | POA: Diagnosis present

## 2015-08-26 DIAGNOSIS — I2510 Atherosclerotic heart disease of native coronary artery without angina pectoris: Secondary | ICD-10-CM

## 2015-08-26 DIAGNOSIS — Z87891 Personal history of nicotine dependence: Secondary | ICD-10-CM | POA: Diagnosis present

## 2015-08-26 DIAGNOSIS — R7303 Prediabetes: Secondary | ICD-10-CM | POA: Diagnosis present

## 2015-08-26 DIAGNOSIS — I213 ST elevation (STEMI) myocardial infarction of unspecified site: Secondary | ICD-10-CM | POA: Diagnosis present

## 2015-08-26 DIAGNOSIS — I10 Essential (primary) hypertension: Secondary | ICD-10-CM | POA: Diagnosis not present

## 2015-08-26 DIAGNOSIS — F419 Anxiety disorder, unspecified: Secondary | ICD-10-CM | POA: Diagnosis present

## 2015-08-26 DIAGNOSIS — I119 Hypertensive heart disease without heart failure: Secondary | ICD-10-CM | POA: Diagnosis present

## 2015-08-26 DIAGNOSIS — Z720 Tobacco use: Secondary | ICD-10-CM

## 2015-08-26 DIAGNOSIS — I959 Hypotension, unspecified: Secondary | ICD-10-CM | POA: Diagnosis present

## 2015-08-26 DIAGNOSIS — I2111 ST elevation (STEMI) myocardial infarction involving right coronary artery: Secondary | ICD-10-CM | POA: Diagnosis present

## 2015-08-26 DIAGNOSIS — I2119 ST elevation (STEMI) myocardial infarction involving other coronary artery of inferior wall: Secondary | ICD-10-CM | POA: Diagnosis present

## 2015-08-26 DIAGNOSIS — R079 Chest pain, unspecified: Secondary | ICD-10-CM | POA: Diagnosis not present

## 2015-08-26 DIAGNOSIS — I9589 Other hypotension: Secondary | ICD-10-CM | POA: Diagnosis not present

## 2015-08-26 HISTORY — DX: ST elevation (STEMI) myocardial infarction involving right coronary artery: I21.11

## 2015-08-26 HISTORY — PX: CARDIAC CATHETERIZATION: SHX172

## 2015-08-26 LAB — COMPREHENSIVE METABOLIC PANEL
ALBUMIN: 3.7 g/dL (ref 3.5–5.0)
ALK PHOS: 81 U/L (ref 38–126)
ALK PHOS: 85 U/L (ref 38–126)
ALT: 15 U/L (ref 14–54)
ALT: 16 U/L (ref 14–54)
ANION GAP: 9 (ref 5–15)
AST: 18 U/L (ref 15–41)
AST: 28 U/L (ref 15–41)
Albumin: 3.4 g/dL — ABNORMAL LOW (ref 3.5–5.0)
Anion gap: 10 (ref 5–15)
BILIRUBIN TOTAL: 0.2 mg/dL — AB (ref 0.3–1.2)
BUN: 13 mg/dL (ref 6–20)
BUN: 12 mg/dL (ref 6–20)
CALCIUM: 8.8 mg/dL — AB (ref 8.9–10.3)
CALCIUM: 8.8 mg/dL — AB (ref 8.9–10.3)
CO2: 25 mmol/L (ref 22–32)
CO2: 28 mmol/L (ref 22–32)
CREATININE: 0.87 mg/dL (ref 0.44–1.00)
CREATININE: 0.83 mg/dL (ref 0.44–1.00)
Chloride: 105 mmol/L (ref 101–111)
Chloride: 103 mmol/L (ref 101–111)
GFR calc Af Amer: >60 mL/min (ref >60)
GFR calc non Af Amer: >60 mL/min (ref >60)
GLUCOSE: 109 mg/dL — AB (ref 65–99)
Glucose, Bld: 137 mg/dL — ABNORMAL HIGH (ref 65–99)
Potassium: 3.1 mmol/L — ABNORMAL LOW (ref 3.5–5.1)
Potassium: 3.9 mmol/L (ref 3.5–5.1)
SODIUM: 140 mmol/L (ref 135–145)
Sodium: 140 mmol/L (ref 135–145)
TOTAL PROTEIN: 5.9 g/dL — AB (ref 6.5–8.1)
Total Bilirubin: 0.5 mg/dL (ref 0.3–1.2)
Total Protein: 6.5 g/dL (ref 6.5–8.1)

## 2015-08-26 LAB — CBC WITH DIFFERENTIAL/PLATELET
BASOS PCT: 0 %
Basophils Absolute: 0 10*3/uL (ref 0.0–0.1)
Eosinophils Absolute: 0.1 10*3/uL (ref 0.0–0.7)
Eosinophils Relative: 1 %
HEMATOCRIT: 41.3 % (ref 36.0–46.0)
HEMOGLOBIN: 14 g/dL (ref 12.0–15.0)
Lymphocytes Relative: 11 %
Lymphs Abs: 1.7 10*3/uL (ref 0.7–4.0)
MCH: 31.2 pg (ref 26.0–34.0)
MCHC: 33.9 g/dL (ref 30.0–36.0)
MCV: 92 fL (ref 78.0–100.0)
MONOS PCT: 3 %
Monocytes Absolute: 0.4 10*3/uL (ref 0.1–1.0)
NEUTROS ABS: 13.1 10*3/uL — AB (ref 1.7–7.7)
NEUTROS PCT: 85 %
Platelets: 275 10*3/uL (ref 150–400)
RBC: 4.49 MIL/uL (ref 3.87–5.11)
RDW: 13 % (ref 11.5–15.5)
WBC: 15.3 10*3/uL — AB (ref 4.0–10.5)

## 2015-08-26 LAB — CBC
HEMATOCRIT: 39.4 % (ref 36.0–46.0)
Hemoglobin: 13.1 g/dL (ref 12.0–15.0)
MCH: 30.5 pg (ref 26.0–34.0)
MCHC: 33.2 g/dL (ref 30.0–36.0)
MCV: 91.8 fL (ref 78.0–100.0)
PLATELETS: 273 10*3/uL (ref 150–400)
RBC: 4.29 MIL/uL (ref 3.87–5.11)
RDW: 12.9 % (ref 11.5–15.5)
WBC: 10.5 10*3/uL (ref 4.0–10.5)

## 2015-08-26 LAB — CK TOTAL AND CKMB (NOT AT ARMC)
CK, MB: 1.8 ng/mL (ref 0.5–5.0)
RELATIVE INDEX: INVALID (ref 0.0–2.5)
Total CK: 55 U/L (ref 38–234)

## 2015-08-26 LAB — TROPONIN I
TROPONIN I: 0.09 ng/mL — AB (ref <0.031)
TROPONIN I: 0.67 ng/mL — AB (ref <0.031)

## 2015-08-26 LAB — MAGNESIUM: Magnesium: 2 mg/dL (ref 1.7–2.4)

## 2015-08-26 LAB — LIPID PANEL
Cholesterol: 230 mg/dL — ABNORMAL HIGH (ref 0–200)
HDL: 48 mg/dL (ref >40)
LDL Cholesterol: 142 mg/dL — ABNORMAL HIGH (ref 0–99)
TRIGLYCERIDES: 201 mg/dL — AB (ref <150)
Total CHOL/HDL Ratio: 4.8 RATIO
VLDL: 40 mg/dL (ref 0–40)

## 2015-08-26 LAB — BRAIN NATRIURETIC PEPTIDE: B Natriuretic Peptide: 51.1 pg/mL (ref 0.0–100.0)

## 2015-08-26 LAB — MRSA PCR SCREENING: MRSA by PCR: NEGATIVE

## 2015-08-26 LAB — APTT: APTT: 23 s — AB (ref 24–37)

## 2015-08-26 LAB — PROTIME-INR
INR: 0.96 (ref 0.00–1.49)
Prothrombin Time: 13 seconds (ref 11.6–15.2)

## 2015-08-26 LAB — POCT ACTIVATED CLOTTING TIME: ACTIVATED CLOTTING TIME: 430 s

## 2015-08-26 LAB — TSH: TSH: 2.157 u[IU]/mL (ref 0.350–4.500)

## 2015-08-26 LAB — T4, FREE: FREE T4: 0.94 ng/dL (ref 0.61–1.12)

## 2015-08-26 SURGERY — LEFT HEART CATH AND CORONARY ANGIOGRAPHY
Anesthesia: LOCAL

## 2015-08-26 MED ORDER — ASPIRIN EC 81 MG PO TBEC: 81.0000 mg | DELAYED_RELEASE_TABLET | Freq: Every day | ORAL | Status: DC

## 2015-08-26 MED ORDER — SODIUM CHLORIDE 0.9 % IV SOLN
250.0000 mg | INTRAVENOUS | Status: DC | PRN
  Administered 2015-08-26: 1.75 mg/kg/h via INTRAVENOUS
  Administered 2015-08-26: 250 mg

## 2015-08-26 MED ORDER — LIDOCAINE HCL (PF) 1 % IJ SOLN
INTRAMUSCULAR | Status: DC | PRN
  Administered 2015-08-26: 15 mL

## 2015-08-26 MED ORDER — ATROPINE SULFATE 0.1 MG/ML IJ SOLN
INTRAMUSCULAR | Status: AC
  Filled 2015-08-26: qty 10

## 2015-08-26 MED ORDER — SODIUM CHLORIDE 0.9 % IV SOLN: INTRAVENOUS | Status: DC

## 2015-08-26 MED ORDER — ZOLPIDEM TARTRATE 5 MG PO TABS: 5.0000 mg | ORAL_TABLET | Freq: Every evening | ORAL | Status: DC | PRN

## 2015-08-26 MED ORDER — SODIUM CHLORIDE 0.9 % IV SOLN
Freq: Once | INTRAVENOUS | Status: AC
  Administered 2015-08-26: 23:00:00 via INTRAVENOUS

## 2015-08-26 MED ORDER — SODIUM CHLORIDE 0.9 % IJ SOLN: 3.0000 mL | INTRAMUSCULAR | Status: DC | PRN

## 2015-08-26 MED ORDER — ACETAMINOPHEN 325 MG PO TABS: 650.0000 mg | ORAL_TABLET | ORAL | Status: DC | PRN

## 2015-08-26 MED ORDER — IOHEXOL 350 MG/ML SOLN
INTRAVENOUS | Status: DC | PRN
  Administered 2015-08-26: 120 mL via INTRA_ARTERIAL

## 2015-08-26 MED ORDER — ASPIRIN 81 MG PO CHEW: 324.0000 mg | CHEWABLE_TABLET | ORAL | Status: DC

## 2015-08-26 MED ORDER — ATORVASTATIN CALCIUM 80 MG PO TABS
80.0000 mg | ORAL_TABLET | Freq: Every day | ORAL | Status: DC
  Administered 2015-08-27: 80 mg via ORAL
  Filled 2015-08-26: qty 1

## 2015-08-26 MED ORDER — METOPROLOL TARTRATE 25 MG PO TABS
25.0000 mg | ORAL_TABLET | Freq: Two times a day (BID) | ORAL | Status: DC
  Administered 2015-08-26 – 2015-08-28 (×4): 25 mg via ORAL
  Filled 2015-08-26 (×4): qty 1

## 2015-08-26 MED ORDER — SODIUM CHLORIDE 0.9 % IV SOLN
INTRAVENOUS | Status: DC
  Administered 2015-08-26: 19:00:00 via INTRAVENOUS

## 2015-08-26 MED ORDER — ONDANSETRON HCL 4 MG/2ML IJ SOLN: 4.0000 mg | Freq: Four times a day (QID) | INTRAMUSCULAR | Status: DC | PRN

## 2015-08-26 MED ORDER — NITROGLYCERIN 0.4 MG SL SUBL: 0.4000 mg | SUBLINGUAL_TABLET | SUBLINGUAL | Status: DC | PRN

## 2015-08-26 MED ORDER — METOPROLOL TARTRATE 12.5 MG HALF TABLET: 12.5000 mg | ORAL_TABLET | Freq: Two times a day (BID) | ORAL | Status: DC

## 2015-08-26 MED ORDER — DIAZEPAM 5 MG PO TABS: 5.0000 mg | ORAL_TABLET | ORAL | Status: DC | PRN

## 2015-08-26 MED ORDER — SODIUM CHLORIDE 0.9 % IV SOLN
INTRAVENOUS | Status: DC | PRN
  Administered 2015-08-26: 1000 mL via INTRAVENOUS

## 2015-08-26 MED ORDER — TICAGRELOR 90 MG PO TABS
ORAL_TABLET | ORAL | Status: DC | PRN
  Administered 2015-08-26: 180 mg via ORAL

## 2015-08-26 MED ORDER — ASPIRIN EC 81 MG PO TBEC
81.0000 mg | DELAYED_RELEASE_TABLET | Freq: Every day | ORAL | Status: DC
  Administered 2015-08-27 – 2015-08-28 (×2): 81 mg via ORAL
  Filled 2015-08-26 (×2): qty 1

## 2015-08-26 MED ORDER — ASPIRIN 300 MG RE SUPP: 300.0000 mg | RECTAL | Status: DC

## 2015-08-26 MED ORDER — OXYCODONE-ACETAMINOPHEN 5-325 MG PO TABS
1.0000 | ORAL_TABLET | ORAL | Status: DC | PRN
  Administered 2015-08-26 – 2015-08-27 (×2): 1 via ORAL
  Filled 2015-08-26 (×2): qty 1

## 2015-08-26 MED ORDER — ATORVASTATIN CALCIUM 80 MG PO TABS: 80.0000 mg | ORAL_TABLET | Freq: Every day | ORAL | Status: DC

## 2015-08-26 MED ORDER — TICAGRELOR 90 MG PO TABS
90.0000 mg | ORAL_TABLET | Freq: Two times a day (BID) | ORAL | Status: DC
  Administered 2015-08-27 – 2015-08-28 (×3): 90 mg via ORAL
  Filled 2015-08-26 (×3): qty 1

## 2015-08-26 MED ORDER — SODIUM CHLORIDE 0.9 % IV SOLN: 250.0000 mL | INTRAVENOUS | Status: DC | PRN

## 2015-08-26 MED ORDER — BIVALIRUDIN BOLUS VIA INFUSION - CUPID
INTRAVENOUS | Status: DC | PRN
  Administered 2015-08-26: 62.25 mg via INTRAVENOUS

## 2015-08-26 MED ORDER — NITROGLYCERIN 1 MG/10 ML FOR IR/CATH LAB
INTRA_ARTERIAL | Status: DC | PRN
  Administered 2015-08-26: 18:00:00

## 2015-08-26 MED ORDER — ALPRAZOLAM 0.25 MG PO TABS
0.2500 mg | ORAL_TABLET | Freq: Two times a day (BID) | ORAL | Status: DC | PRN
  Administered 2015-08-27: 0.25 mg via ORAL
  Filled 2015-08-26: qty 1

## 2015-08-26 MED ORDER — SODIUM CHLORIDE 0.9 % IJ SOLN
3.0000 mL | Freq: Two times a day (BID) | INTRAMUSCULAR | Status: DC
  Administered 2015-08-26 – 2015-08-28 (×4): 3 mL via INTRAVENOUS

## 2015-08-26 MED ORDER — MIDAZOLAM HCL 2 MG/2ML IJ SOLN
INTRAMUSCULAR | Status: DC | PRN
  Administered 2015-08-26: 1 mg via INTRAVENOUS

## 2015-08-26 MED ORDER — FENTANYL CITRATE (PF) 100 MCG/2ML IJ SOLN
INTRAMUSCULAR | Status: DC | PRN
  Administered 2015-08-26: 50 ug via INTRAVENOUS

## 2015-08-26 SURGICAL SUPPLY — 18 items
BALLN EMERGE MR 2.5X12 (BALLOONS) ×2
BALLN ~~LOC~~ EUPHORA RX 3.75X12 (BALLOONS) ×2
BALLOON EMERGE MR 2.5X12 (BALLOONS) IMPLANT
BALLOON ~~LOC~~ EUPHORA RX 3.75X12 (BALLOONS) IMPLANT
CATH INFINITI 5FR ANG PIGTAIL (CATHETERS) ×1 IMPLANT
CATH INFINITI 5FR MULTPACK ANG (CATHETERS) ×1 IMPLANT
CATH VISTA GUIDE 6FR JR4 (CATHETERS) ×1 IMPLANT
ELECT DEFIB PAD ADLT CADENCE (PAD) ×1 IMPLANT
KIT ENCORE 26 ADVANTAGE (KITS) ×1 IMPLANT
KIT HEART LEFT (KITS) ×2 IMPLANT
PACK CARDIAC CATHETERIZATION (CUSTOM PROCEDURE TRAY) ×2 IMPLANT
SHEATH PINNACLE 6F 10CM (SHEATH) ×1 IMPLANT
STENT RESOLUTE INTEG 3.5X15 (Permanent Stent) ×1 IMPLANT
SYR MEDRAD MARK V 150ML (SYRINGE) ×2 IMPLANT
TRANSDUCER W/STOPCOCK (MISCELLANEOUS) ×2 IMPLANT
TUBING CIL FLEX 10 FLL-RA (TUBING) ×2 IMPLANT
WIRE COUGAR XT STRL 190CM (WIRE) ×1 IMPLANT
WIRE EMERALD 3MM-J .035X150CM (WIRE) ×1 IMPLANT

## 2015-08-26 NOTE — H&P (Signed)
Leslie GroutBetty D Dorsey is an 64 y.o. female.    Primary Cardiologist:New--Dr. Tresa EndoKelly  PCP: Farris HasMORROW, AARON, MD  Chief Complaint: chest pain  HPI: 64 year old female with no prior cardiac hx, + HTN presents with about a weed of intermittent chest pain.  Today about 1 hour prior to arrival developed significant chest pain that would not go away.  + associated symptoms of nausea, SOB and diaphoresis, + pale.  She took an ASA at home 325 mg.  She rec'd zofran but due to low BP unable to give NTG or pain meds.  EMS was called and She has 2-3 mmST elevation in II,III, AVF and reciprocal changes in ant leads.  Still with chest pain on arrival to cath lab.  She has HTN, no hx CVA, DM or GI bleeding.  She does continue to smoke.    Past Medical History  Diagnosis Date  . Hypertension     Past Surgical History  Procedure Laterality Date  . Abdominal hysterectomy    . Hemorroidectomy      Family History  Problem Relation Age of Onset  . Heart disease Mother   . Heart attack Mother    Social History:  reports that she has been smoking.  She does not have any smokeless tobacco history on file. She reports that she does not drink alcohol or use illicit drugs.  Allergies:  Allergies  Allergen Reactions  . Morphine And Related Anaphylaxis  . Levofloxacin Rash   OUTPATIENT MEDICATIONS: Losartan  amlodipine  No results found for this or any previous visit (from the past 48 hour(s)). No results found.  ROS: General:no colds or fevers, no weight changes Skin:no rashes or ulcers HEENT:no blurred vision, no congestion CV:see HPI PUL:see HPI GI:no diarrhea constipation or melena, no indigestion GU:no hematuria, no dysuria MS:no joint pain, no claudication Neuro:no syncope, no lightheadedness Endo:no diabetes, no thyroid disease    BP 160/75 P 65 R 16    PE: General:Pleasant affect, in obvious pain, pale but warm Skin:Warm and dry, brisk capillary  refill HEENT:normocephalic, sclera clear, mucus membranes moist Neck:supple, no JVD, no bruits  Heart:S1S2 RRR without murmur, gallup, rub or click Lungs:clear, ant without rales, rhonchi, or wheezes WUJ:WJXBAbd:soft, non tender, + BS, do not palpate liver spleen or masses Ext:no lower ext edema, 2+ pedal pulses, 2+ radial pulses Neuro:alert and oriented, MAE, follows commands, + facial symmetry    Assessment/Plan Principal Problem:   STEMI (ST elevation myocardial infarction) (HCC)- inf wall -emergent to cath lab, high dose statin, BB, post cath orders. Admit to ICU  Active Problems:   Chest pain at rest   Hypotension   HTN -essential    [facility] HospitalNGOLD,LAURA R Nurse Practitioner Certified [facility] Pager [phone] or after 5pm or weekends call [phone] 08/26/2015, 5:09 PM   Patient seen and examined. Agree with assessment and plan.  Leslie Dorsey is a 64 year old Caucasian female who has a long history of tobacco abuse as well as a history of hypertension and hyperlipidemia.  For the past week she has been experiencing intermittent episodes of chest, back discomfort.  Today she experienced more severe chest pain.  EMS arrived and she was found to have 2-3 mm of inferior ST segment elevation consistent with an acute inferior injury current.  A code STEMI was activated and [facility] cardiac [facility].  ECG upon arrival reviewed by me independently reveals sinus bradycardia  at 54 bpm with PACs, 3 - 4 mm of inferior injury current, with ST elevation with precordial ST depression and T-wave inversion in V1 through V3 as well as in leads 1 and aVL with mild 1 mm J-point elevation in V5 and V6.  I discussed the findings with the patient.  Suspect total RCA occlusion and a large dominant RCA vessel; plan emergent cardiac catheterization with possible intervention.   Lennette Bihari, MD, [facility] 08/26/2015 5:57 PM

## 2015-08-26 NOTE — Progress Notes (Signed)
Pulled pt's right femoral sheath. Level 0. VSS throughout procedure. Held pressure for 25 minutes, another RN at bedside. Pt understands post sheath care instructions.

## 2015-08-27 ENCOUNTER — Inpatient Hospital Stay (HOSPITAL_COMMUNITY): Payer: 59

## 2015-08-27 ENCOUNTER — Encounter (HOSPITAL_COMMUNITY): Payer: Self-pay | Admitting: Cardiovascular Disease

## 2015-08-27 DIAGNOSIS — F419 Anxiety disorder, unspecified: Secondary | ICD-10-CM | POA: Diagnosis present

## 2015-08-27 DIAGNOSIS — Z720 Tobacco use: Secondary | ICD-10-CM

## 2015-08-27 DIAGNOSIS — I9589 Other hypotension: Secondary | ICD-10-CM

## 2015-08-27 DIAGNOSIS — I10 Essential (primary) hypertension: Secondary | ICD-10-CM

## 2015-08-27 DIAGNOSIS — Z87891 Personal history of nicotine dependence: Secondary | ICD-10-CM | POA: Diagnosis present

## 2015-08-27 DIAGNOSIS — R079 Chest pain, unspecified: Secondary | ICD-10-CM

## 2015-08-27 LAB — LIPID PANEL
CHOL/HDL RATIO: 4.8 ratio
Cholesterol: 232 mg/dL — ABNORMAL HIGH (ref 0–200)
HDL: 48 mg/dL (ref >40)
LDL CALC: 146 mg/dL — AB (ref 0–99)
Triglycerides: 191 mg/dL — ABNORMAL HIGH (ref <150)
VLDL: 38 mg/dL (ref 0–40)

## 2015-08-27 LAB — HEMOGLOBIN A1C
HEMOGLOBIN A1C: 5.8 % — AB (ref 4.8–5.6)
Hgb A1c MFr Bld: 5.8 % — ABNORMAL HIGH (ref 4.8–5.6)
Mean Plasma Glucose: 120 mg/dL
Mean Plasma Glucose: 120 mg/dL

## 2015-08-27 LAB — BASIC METABOLIC PANEL
ANION GAP: 9 (ref 5–15)
BUN: 7 mg/dL (ref 6–20)
CO2: 29 mmol/L (ref 22–32)
Calcium: 8.9 mg/dL (ref 8.9–10.3)
Chloride: 104 mmol/L (ref 101–111)
Creatinine, Ser: 0.77 mg/dL (ref 0.44–1.00)
GFR calc Af Amer: >60 mL/min (ref >60)
GFR calc non Af Amer: >60 mL/min (ref >60)
GLUCOSE: 104 mg/dL — AB (ref 65–99)
POTASSIUM: 3.5 mmol/L (ref 3.5–5.1)
Sodium: 142 mmol/L (ref 135–145)

## 2015-08-27 LAB — TROPONIN I
Troponin I: 2.67 ng/mL (ref <0.031)
Troponin I: 7.13 ng/mL (ref <0.031)

## 2015-08-27 LAB — CBC
HCT: 39.4 % (ref 36.0–46.0)
Hemoglobin: 13.2 g/dL (ref 12.0–15.0)
MCH: 30.8 pg (ref 26.0–34.0)
MCHC: 33.5 g/dL (ref 30.0–36.0)
MCV: 91.8 fL (ref 78.0–100.0)
PLATELETS: 263 10*3/uL (ref 150–400)
RBC: 4.29 MIL/uL (ref 3.87–5.11)
RDW: 13 % (ref 11.5–15.5)
WBC: 13.6 10*3/uL — AB (ref 4.0–10.5)

## 2015-08-27 LAB — POCT I-STAT, CHEM 8
BUN: 14 mg/dL (ref 6–20)
CALCIUM ION: 1.18 mmol/L (ref 1.13–1.30)
CREATININE: 0.8 mg/dL (ref 0.44–1.00)
Chloride: 103 mmol/L (ref 101–111)
GLUCOSE: 132 mg/dL — AB (ref 65–99)
HCT: 40 % (ref 36.0–46.0)
HEMOGLOBIN: 13.6 g/dL (ref 12.0–15.0)
POTASSIUM: 3 mmol/L — AB (ref 3.5–5.1)
Sodium: 141 mmol/L (ref 135–145)
TCO2: 25 mmol/L (ref 0–100)

## 2015-08-27 LAB — PROTIME-INR
INR: 1 (ref 0.00–1.49)
PROTHROMBIN TIME: 13.4 s (ref 11.6–15.2)

## 2015-08-27 MED ORDER — NICOTINE 14 MG/24HR TD PT24
14.0000 mg | MEDICATED_PATCH | Freq: Every day | TRANSDERMAL | Status: DC
  Administered 2015-08-27 – 2015-08-28 (×2): 14 mg via TRANSDERMAL
  Filled 2015-08-27 (×2): qty 1

## 2015-08-27 MED ORDER — LOSARTAN POTASSIUM-HCTZ 50-12.5 MG PO TABS: 1.0000 | ORAL_TABLET | Freq: Every day | ORAL | Status: DC

## 2015-08-27 MED ORDER — HYDROCHLOROTHIAZIDE 12.5 MG PO CAPS
12.5000 mg | ORAL_CAPSULE | Freq: Every day | ORAL | Status: DC
  Administered 2015-08-27 – 2015-08-28 (×2): 12.5 mg via ORAL
  Filled 2015-08-27 (×2): qty 1

## 2015-08-27 MED ORDER — ALBUTEROL SULFATE (2.5 MG/3ML) 0.083% IN NEBU: 3.0000 mL | INHALATION_SOLUTION | RESPIRATORY_TRACT | Status: DC | PRN

## 2015-08-27 MED ORDER — ALBUTEROL SULFATE (2.5 MG/3ML) 0.083% IN NEBU
3.0000 mL | INHALATION_SOLUTION | RESPIRATORY_TRACT | Status: DC
  Administered 2015-08-27 (×2): 3 mL via RESPIRATORY_TRACT
  Filled 2015-08-27 (×3): qty 3

## 2015-08-27 MED ORDER — LOSARTAN POTASSIUM 50 MG PO TABS
50.0000 mg | ORAL_TABLET | Freq: Every day | ORAL | Status: DC
  Administered 2015-08-27 – 2015-08-28 (×2): 50 mg via ORAL
  Filled 2015-08-27 (×2): qty 1

## 2015-08-27 MED ORDER — ALBUTEROL SULFATE (2.5 MG/3ML) 0.083% IN NEBU: 2.5000 mg | INHALATION_SOLUTION | Freq: Four times a day (QID) | RESPIRATORY_TRACT | Status: DC | PRN

## 2015-08-27 MED ORDER — GUAIFENESIN ER 600 MG PO TB12
600.0000 mg | ORAL_TABLET | Freq: Two times a day (BID) | ORAL | Status: DC | PRN
  Administered 2015-08-27: 600 mg via ORAL
  Filled 2015-08-27: qty 1

## 2015-08-27 MED ORDER — LISINOPRIL 5 MG PO TABS: 5.0000 mg | ORAL_TABLET | Freq: Every day | ORAL | Status: DC

## 2015-08-27 MED FILL — Bivalirudin For IV Soln 250 MG: INTRAVENOUS | Qty: 250 | Status: AC

## 2015-08-27 MED FILL — Fentanyl Citrate Preservative Free (PF) Inj 100 MCG/2ML: INTRAMUSCULAR | Qty: 2 | Status: AC

## 2015-08-27 MED FILL — Lidocaine HCl Local Preservative Free (PF) Inj 1%: INTRAMUSCULAR | Qty: 60 | Status: AC

## 2015-08-27 NOTE — Progress Notes (Signed)
CARDIAC REHAB PHASE I   PRE:  Rate/Rhythm: 65 SR  BP:  Supine: 138/87  Sitting:   Standing:    SaO2: 97 RA  MODE:  Ambulation: 350 ft   POST:  Rate/Rhythm: 82 SR  BP:  Supine:   Sitting: 149/66  Standing:    SaO2: 99 RA 1105-1200 Pt tolerated ambulation without c/o of cp. She did c/o of being tired and felt SOB walking. RA sat after walk 99%. Pt to chair after walk. Started MI and stent education. Gave pt MI booklet and stent card. We discussed risk factors,modifications and smoking cessation. Pt voices interest in not smoking and wants to use the nicotine patch or gum. She states that I have got to quit because I do not want this to happen to me again.I gave her information on cessation and encouraged her to use the quit smoking line.  Pt is very resistant and states that she will not take cholesterol lowing medications. She states that she had started on medication for cholesterol two days prior to her MI and she feels that that caused this to happen to her. Pt is unwilling to even discuss taking the medication. She is willing to take the Brilinta and ASA and other medications she is prescribed, just not the cholesterol medication. It was difficult to keep pt's attention and she states she was tired. We will return tomorrow to finish her education.  Melina CopaLisa Arley Garant RN 08/27/2015 12:03 PM

## 2015-08-27 NOTE — Progress Notes (Signed)
Utilization review completed.  

## 2015-08-27 NOTE — Progress Notes (Signed)
Patient has Ventolin MDI from home and uses on her schedule.  Refuses nebulized albuterol treatments.  RT will continue to follow as needed.

## 2015-08-27 NOTE — Progress Notes (Signed)
Cardiologist: Dr. Tresa Endo new  Subjective:   Feels sensation like intermittent trouble getting in deep breath. Anxious. Tob. Wants albuterol. Wants patch. Stress with husband.   Objective:  Vital Signs in the last 24 hours: Temp:  [97.8 F (36.6 C)-98 F (36.7 C)] 98 F (36.7 C) (12/21 0400) Pulse Rate:  [0-85] 66 (12/21 0700) Resp:  [12-28] 21 (12/21 0700) BP: (124-170)/(64-111) 147/64 mmHg (12/21 0500) SpO2:  [0 %-100 %] 97 % (12/21 0700) Weight:  [145 lb 8.1 oz (66 kg)-187 lb 6.3 oz (85 kg)] 187 lb 6.3 oz (85 kg) (12/21 0500)  Intake/Output from previous day: 12/20 0701 - 12/21 0700 In: 1475 [I.V.:1475] Out: 1000 [Urine:1000]   Physical Exam: General: Well developed, well nourished, in no acute distress. Head:  Normocephalic and atraumatic. Lungs: Clear to auscultation and percussion. Heart: Normal S1 and S2.  No murmur, rubs or gallops.  Abdomen: soft, non-tender, positive bowel sounds. Extremities: No clubbing or cyanosis. No edema. Neurologic: Alert and oriented x 3.    Lab Results:  Recent Labs  08/26/15 1704 08/26/15 1705 08/26/15 1927  WBC 10.5  --  15.3*  HGB 13.1 13.6 14.0  PLT 273  --  275    Recent Labs  08/26/15 1704 08/26/15 1705 08/26/15 1927  NA 140 141 140  K 3.1* 3.0* 3.9  CL 105 103 103  CO2 25  --  28  GLUCOSE 137* 132* 109*  BUN CREATININE 0.87 0.80 0.83    Recent Labs  08/26/15 1927 08/27/15 0034  TROPONINI 0.67* 2.67*   Hepatic Function Panel  Recent Labs  08/26/15 1927  PROT 6.5  ALBUMIN 3.7  AST 28  ALT 16  ALKPHOS 85  BILITOT 0.5    Recent Labs  08/26/15 1704  CHOL 230*     Imaging: Portable Chest X-ray 1 View  08/26/2015  CLINICAL DATA:  Myocardial infarction.  Chest pain EXAM: PORTABLE CHEST 1 VIEW COMPARISON:  Jan 14, 2015. FINDINGS: Lungs are clear. Heart size and pulmonary vascularity are normal. No adenopathy. No pneumothorax. No bone lesions. IMPRESSION: No edema or consolidation.  Electronically Signed   By: Bretta Bang III M.D.   On: 08/26/2015 18:47   Personally viewed.   Telemetry: No VT. NSR Personally viewed.   EKG:  08/27/15 - NSR, minimal TWI 3, F. Improved.  Personally viewed.  Cardiac Studies:  Cath 08/26/15:  Ost Cx lesion, 10% stenosed.  Mid LAD to Dist LAD lesion, 20% stenosed.  Prox RCA lesion, 99% stenosed. Post intervention, there is a 0% residual stenosis.  The left ventricular systolic function is normal, 55%.  Acute inferior ST segment elevation myocardial infarction secondary to subtotal 99% RCA stenosis with thrombus in the proximal to midsegment and a large dominant RCA.  Mild concomitant nonobstructive CAD with smooth 20% narrowing in the LAD, and 10% ostial narrowing in the left circumflex coronary artery.  Preserved LV function with an acute ejection fraction of 55%.  Successful PCI to a dominant RCA with a 99% stenosis being reduced to 0% with ultimate insertion of a 3.515 mm Resolute DES stent postdilated to 3.70 with 99% stenosis reduced to 0% and brisk TIMI-3 flow.  RECOMMENDATION: Patient will be maintained on dual antiplatelet therapy for minimum of a year. She will be started on very low dose beta blocker therapy and Ace inhibition as blood pressure allows. Aggressive statin intervention will be necessary. Smoking cessation is essential.  Meds: Scheduled Meds: . albuterol  3 mL  Inhalation Q4H  . aspirin EC  81 mg Oral Daily  . atorvastatin  80 mg Oral q1800  . metoprolol tartrate  25 mg Oral BID  . sodium chloride  3 mL Intravenous Q12H  . ticagrelor  90 mg Oral BID   Continuous Infusions: . sodium chloride    . sodium chloride 150 mL/hr at 08/26/15 1830   PRN Meds:.sodium chloride, acetaminophen, ALPRAZolam, diazepam, nitroGLYCERIN, ondansetron (ZOFRAN) IV, oxyCODONE-acetaminophen, sodium chloride, zolpidem  Assessment/Plan:  Principal Problem:   STEMI (ST elevation myocardial infarction) (HCC) Active  Problems:   Chest pain at rest   Hypotension   HTN (hypertension)   ST elevation (STEMI) myocardial infarction involving right coronary artery (HCC)  Inferior STEMI  - RCA DES  - DAPT, Brilinta, ASA  - Metoprolol  - high dose statin  - EF normal  - No VT  - OK for transfer to floor  - Trop 2-3  - Cardiac rehab  Tob Cessation  - Nicotine patch  Dyspnea  - EF normal, LVEDP 8mmHg  - smoker  - albuterol  Anxiety  - Xanax  - may be playing a role in her sensation of not getting in a deep breath. CXR was OK.   Hypertensive heart dz  - BP low post intervention. Monitor. Metoprolol  - will add lisinopril 5mg  QD  Hyperlipidemia  - statin, atorva 80  SKAINS, MARK, MD  08/27/2015, 8:49 AM

## 2015-08-27 NOTE — Progress Notes (Deleted)
Patient has home CPAP machine and will place on when ready. RT will continue to monitor.

## 2015-08-27 NOTE — Progress Notes (Signed)
Pt troponin 0.67 post cath. Cardiology Fellow notified of expected find. Pt vitals signs stable will continue to monitor pt.

## 2015-08-27 NOTE — Progress Notes (Signed)
  Echocardiogram 2D Echocardiogram has been performed.  Leslie Dorsey, Leslie Dorsey 08/27/2015, 2:52 PM

## 2015-08-28 ENCOUNTER — Telehealth: Payer: Self-pay | Admitting: Cardiovascular Disease

## 2015-08-28 MED ORDER — LOSARTAN POTASSIUM-HCTZ 50-12.5 MG PO TABS: 1.0000 | ORAL_TABLET | Freq: Every day | ORAL | Status: DC

## 2015-08-28 MED ORDER — TICAGRELOR 90 MG PO TABS: 90.0000 mg | ORAL_TABLET | Freq: Two times a day (BID) | ORAL | Status: DC

## 2015-08-28 MED ORDER — NICOTINE 14 MG/24HR TD PT24: 14.0000 mg | MEDICATED_PATCH | Freq: Every day | TRANSDERMAL | Status: DC

## 2015-08-28 MED ORDER — NITROGLYCERIN 0.4 MG SL SUBL: 0.4000 mg | SUBLINGUAL_TABLET | SUBLINGUAL | Status: DC | PRN

## 2015-08-28 MED ORDER — ASPIRIN 81 MG PO TBEC: 81.0000 mg | DELAYED_RELEASE_TABLET | Freq: Every day | ORAL | Status: AC

## 2015-08-28 MED ORDER — ATORVASTATIN CALCIUM 80 MG PO TABS: 80.0000 mg | ORAL_TABLET | Freq: Every day | ORAL | Status: DC

## 2015-08-28 MED ORDER — METOPROLOL TARTRATE 25 MG PO TABS: 25.0000 mg | ORAL_TABLET | Freq: Two times a day (BID) | ORAL | Status: DC

## 2015-08-28 NOTE — Telephone Encounter (Signed)
Returned call to patient.She stated already taken care of she was told to take mucinex.

## 2015-08-28 NOTE — Discharge Summary (Signed)
Discharge Summary   Patient ID: Leslie Dorsey,  MRN: [id number], DOB/AGE: 11-Jul-1951 64 y.o.  Admit date: 08/26/2015 Discharge date: 08/28/2015  Primary Care Provider: Farris Has Primary Cardiologist: Dr. Tresa Endo  Discharge Diagnoses Principal Problem:   ST elevation (STEMI) myocardial infarction involving right coronary artery [facility]) Active Problems:   Chest pain at rest   Hypotension   HTN (hypertension)   Tobacco abuse   Anxiety   Allergies Allergies  Allergen Reactions  . Morphine And Related Anaphylaxis  . Levofloxacin Rash    Procedures  Echocardiogram 08/27/2015 LV EF: 55% -  60%  ------------------------------------------------------------------- Indications:   Chest pain 786.51.  ------------------------------------------------------------------- History:  Risk factors: Hypertension.  ------------------------------------------------------------------- Study Conclusions  - Left ventricle: The cavity size was normal. There was mild concentric hypertrophy. Systolic function was normal. The estimated ejection fraction was in the range of 55% to 60%. Wall motion was normal; there were no regional wall motion abnormalities. Features are consistent with a pseudonormal left ventricular filling pattern, with concomitant abnormal relaxation and increased filling pressure (grade 2 diastolic dysfunction).    Cardiac catheterization 08/26/2015 Conclusion     Ost Cx lesion, 10% stenosed.  Mid LAD to Dist LAD lesion, 20% stenosed.  Prox RCA lesion, 99% stenosed. Post intervention, there is a 0% residual stenosis.  The left ventricular systolic function is normal.  Acute inferior ST segment elevation myocardial infarction secondary to subtotal 99% RCA stenosis with thrombus in the proximal to midsegment and a large dominant RCA.  Mild concomitant nonobstructive CAD with smooth 20% narrowing in the LAD, and 10% ostial narrowing in the  left circumflex coronary artery.  Preserved LV function with an acute ejection fraction of 55%.  Successful PCI to a dominant RCA with a 99% stenosis being reduced to 0% with ultimate insertion of a 3.515 mm Resolute DES stent postdilated to 3.70 with 99% stenosis reduced to 0% and brisk TIMI-3 flow.  RECOMMENDATION: Patient will be maintained on dual antiplatelet therapy for minimum of a year. She will be started on very low dose beta blocker therapy and Ace inhibition as blood pressure allows. Aggressive statin intervention will be necessary. Smoking cessation is essential.       Hospital Course  Leslie Dorsey is a 64 year old female with past medical history of hypertension and tobacco abuse, however no prior cardiac history who presented to [facility] on 08/26/2015 with inferior STEMI. She underwent emergent cardiac catheterization on the same day which showed a 99% prox RCA lesion with thrombus in prox to mid RCA treated with 3.5x73mm Resolute DES postdilated to 3.40mm, 20% mid to dis LAD lesion, 10% ost LCx lesion, EF 55%. Echocardiogram obtained on the following day showed EF 55-60%, grade 2 diastolic dysfunction, no regional wall motion abnormality. Post cath, she was started on aspirin and Brilinta along with Lipitor and metoprolol and home losartan/hydrochlorothiazide. Home amlodipine has been discontinued. Lipid panel obtained showed cholesterol 230, triglycerides 191, LDL 142, HDL 48. Hemoglobin A1c was in prediabetes level of 5.8.  She was seen in the morning of 08/28/2015, at which time she denies any significant chest discomfort or shortness of breath. Her right femoral cath site appears to be stable without significant bleeding or hematoma. She has ambulated with cardiac rehabilitation without discomfort. Further discussion has been held with the patient regarding the need to be compliant with DAPT and tobacco sensation. Patient did display willingness to quit smoking at  this time. She is deemed stable for discharge from cardiology perspective. I have  given the patient 30 day Brilinta prescription to go with her free coupon. I have also sent one year Brilinta prescription to her pharmacy. Outpatient follow-up has been arranged for this patient. I have advised her to followup with her PCP as soon as possible.   Discharge Vitals Blood pressure 117/78, pulse 72, temperature 97.7 F (36.5 C), temperature source Oral, resp. rate 18, height 5\' 5"  (1.651 m), weight 184 lb 12.8 oz (83.825 kg), SpO2 98 %.  Filed Weights   08/27/15 0500 08/27/15 1509 08/28/15 0400  Weight: 187 lb 6.3 oz (85 kg) 187 lb 9.6 oz (85.095 kg) 184 lb 12.8 oz (83.825 kg)    Labs  CBC  Recent Labs  08/26/15 1927 08/27/15 1220  WBC 15.3* 13.6*  NEUTROABS 13.1*  --   HGB 14.0 13.2  HCT 41.3 39.4  MCV 92.0 91.8  PLT 275 263   Basic Metabolic Panel  Recent Labs  08/26/15 1927 08/27/15 1220  NA 140 142  K 3.9 3.5  CL 103 104  CO2 28 29  GLUCOSE 109* 104*  BUN 12 7  CREATININE 0.83 0.77  CALCIUM 8.8* 8.9  MG 2.0  --    Liver Function Tests  Recent Labs  08/26/15 1704 08/26/15 1927  AST 18 28  ALT 15 16  ALKPHOS 81 85  BILITOT 0.2* 0.5  PROT 5.9* 6.5  ALBUMIN 3.4* 3.7   No results for input(s): LIPASE, AMYLASE in the last 72 hours. Cardiac Enzymes  Recent Labs  08/26/15 1704 08/26/15 1927 08/27/15 0034 08/27/15 1220  CKTOTAL 55  --   --   --   CKMB 1.8  --   --   --   TROPONINI 0.09* 0.67* 2.67* 7.13*   BNP Invalid input(s): POCBNP D-Dimer No results for input(s): DDIMER in the last 72 hours. Hemoglobin A1C  Recent Labs  08/26/15 1927  HGBA1C 5.8*   Fasting Lipid Panel  Recent Labs  08/27/15 1220  CHOL 232*  HDL 48  LDLCALC 146*  TRIG 191*  CHOLHDL 4.8   Thyroid Function Tests  Recent Labs  08/26/15 1927  TSH 2.157    Disposition  Pt is being discharged home today in good condition.  Follow-up Plans & Appointments        Follow-up Information    Follow up with Farris HasMORROW, AARON, MD.   Specialty:  Family Medicine   Why:  followup with primary care physician as soon as possible.   Contact information:   [address] [place] [address] [phone]       Follow up with Laurann Montanaayna N Dunn, PA-C On 09/05/2015.   Specialties:  Cardiology, Radiology   Why:  9:30am. Transition of care followup. Dr. Landry DykeKelly's office schedule does not have open slots, [facility], then will arrange followup with Dr. Janyth PupaKelly   Contact information:   [address] [place] [address] [phone]       Discharge Medications    Medication List    STOP taking these medications        amLODipine 5 MG tablet  Commonly known as:  NORVASC      TAKE these medications        aspirin 81 MG EC tablet  Take 1 tablet (81 mg total) by mouth daily.     atorvastatin 80 MG tablet  Commonly known as:  LIPITOR  Take 1 tablet (80 mg total) by mouth daily at 6 PM.  clonazePAM 1 MG tablet  Commonly known as:  KLONOPIN  Take 1 mg by mouth daily as needed for anxiety.     cyclobenzaprine 10 MG tablet  Commonly known as:  FLEXERIL  Take 5 mg by mouth daily as needed for muscle spasms.     KRILL OIL PO  Take 1 tablet by mouth daily at 12 noon.     losartan-hydrochlorothiazide 50-12.5 MG tablet  Commonly known as:  HYZAAR  Take 1 tablet by mouth daily.     metoprolol tartrate 25 MG tablet  Commonly known as:  LOPRESSOR  Take 1 tablet (25 mg total) by mouth 2 (two) times daily.     nicotine 14 mg/24hr patch  Commonly known as:  NICODERM CQ - dosed in mg/24 hours  Place 1 patch (14 mg total) onto the skin daily.     nitroGLYCERIN 0.4 MG SL tablet  Commonly known as:  NITROSTAT  Place 1 tablet (0.4 mg total) under the tongue every 5 (five) minutes x 3 doses as needed for chest pain.     ticagrelor 90 MG Tabs tablet  Commonly known as:  BRILINTA  Take 1 tablet  (90 mg total) by mouth 2 (two) times daily.     VITAMIN B-12 PO  Take 1 tablet by mouth daily at 12 noon.     VITAMIN B-6 PO  Take 1 tablet by mouth daily at 12 noon.        Duration of Discharge Encounter   Greater than 30 minutes including physician time.  Ramond Dial PA-C Pager: [phone] 08/28/2015, 3:04 PM   Personally seen and examined. Agree with above. INF STEMI - RCA stent - DAPT - TOB cessation  OK for DC She looks well, ambulating.   Donato Schultz, MD

## 2015-08-28 NOTE — Progress Notes (Signed)
Patient being discharged home per MD order. All discharge instructions given to patient and gone over, education included in discharge packet.

## 2015-08-28 NOTE — Telephone Encounter (Signed)
The Answering Service took this message last night: Feels a sinus coming on and wants meds for it.

## 2015-08-28 NOTE — Progress Notes (Signed)
CARDIAC REHAB PHASE I   PRE:  Rate/Rhythm: 78 SR  BP:  Sitting: 135/81        SaO2: 96 RA  MODE:  Ambulation: 150 ft   POST:  Rate/Rhythm: 96 SR  BP:  Sitting: 123/85         SaO2: 95 RA  Pt ambulated 150 ft on RA, handheld assist, steady gait, tolerated well.  Pt denies cp, dizziness, DOE, declined rest stop, returned to room to receive visit from her pastor. Completed MI/stent education.  Reviewed anti-platelet therapy (CM to see pt prior to discharge re brilinta), stent card, tobacco cessation, activity restrictions, ntg, exercise, heart healthy and phase 2 cardiac rehab. Pt verbalized understanding. Pt agrees to phase 2 cardiac rehab referral, [facility]. Pt to bed per pt request after walk, call bell within reach.  [phone]   Joylene GrapesMonge, Ericia Moxley C, RN, BSN 08/28/2015 9:35 AM

## 2015-08-28 NOTE — Discharge Instructions (Signed)
No driving for 24 hours. No lifting over 5 lbs for 1 week. No sexual activity for 1 week. Keep procedure site clean & dry. If you notice increased pain, swelling, bleeding or pus, call/return!  You may shower, but no soaking baths/hot tubs/pools for 1 week.  ° ° ° °Acute Coronary Syndrome °Acute coronary syndrome (ACS) is a serious problem in which there is suddenly not enough blood and oxygen supplied to the heart. ACS may mean that one or more of the blood vessels in your heart (coronary arteries) may be blocked. ACS can result in chest pain or a heart attack (myocardial infarction or MI). °CAUSES °This condition is caused by atherosclerosis, which is the buildup of fat and cholesterol (plaque) on the inside of the arteries. Over time, the plaque may narrow or block the artery, and this will lessen blood flow to the heart. Plaque can also become weak and break off within a coronary artery to form a clot and cause a sudden blockage. °RISK FACTORS °The risks factors of this condition include: °· High cholesterol levels. °· High blood pressure (hypertension). °· Smoking. °· Diabetes. °· Age. °· Family history of chest pain, heart disease, or stroke. °· Lack of exercise. °SYMPTOMS °The most common signs of this condition include: °· Chest pain, which can be: °¨ A crushing or squeezing in the chest. °¨ A tightness, pressure, fullness, or heaviness in the chest. °¨ Present for more than a few minutes, or it can stop and recur. °· Pain in the arms, neck, jaw, or back. °· Unexplained heartburn or indigestion. °· Shortness of breath. °· Nausea. °· Sudden cold sweats. °· Feeling light-headed or dizzy. °Sometimes, this condition has no symptoms. °DIAGNOSIS °ACS may be diagnosed through the following tests: °· Electrocardiogram (ECG). °· Blood tests. °· Coronary angiogram. This is a procedure to look at the coronary arteries to see if there is any blockage. °TREATMENT °Treatment for ACS may include: °· Healthy behavioral  changes to reduce or control risk factors. °· Medicine. °· Coronary stenting. A stent helps to keep an artery open. °· Coronary angioplasty. This procedure widens a narrowed or blocked artery. °· Coronary artery bypass surgery. This will allow your blood to pass the blockage (bypass) to reach your heart. °HOME CARE INSTRUCTIONS °Eating and Drinking °· Follow a heart-healthy diet. A dietitian can you help to educate you about healthy food options and changes. °· Use healthy cooking methods such as roasting, grilling, broiling, baking, poaching, steaming, or stir-frying. Talk to a dietitian to learn more about healthy cooking methods. °Medicines °· Take medicines only as directed by your health care provider. °· Do not take the following medicines unless your health care provider approves: °¨ Nonsteroidal anti-inflammatory drugs (NSAIDs), such as ibuprofen, naproxen, or celecoxib. °¨ Vitamin supplements that contain vitamin A, vitamin E, or both. °¨ Hormone replacement therapy that contains estrogen with or without progestin. °· Stop illegal drug use. °Activities °· Follow an exercise program that is approved by your health care provider. °· Plan rest periods when you are fatigued. °Lifestyle °· Do not use any tobacco products, including cigarettes, chewing tobacco, or electronic cigarettes. If you need help quitting, ask your health care provider. °· If you drink alcohol, and your health care provider approves, limit your alcohol intake to no more than 1 drink per day. One drink equals 12 ounces of beer, 5 ounces of wine, or 1½ ounces of hard liquor. °· Learn to manage stress. °· Maintain a healthy weight. Lose weight as approved   by your health care provider. °General Instructions °· Manage other health conditions, such as hypertension and diabetes, as directed by your health care provider. °· Keep all follow-up visits as directed by your health care provider. This is important. °· Your health care provider may ask  you to monitor your blood pressure. A blood pressure reading consists of a higher number over a lower number, such as 110 over 72, written as 110/72. Ideally, your blood pressure should be: °¨ Below 140/90 if you have no other medical conditions. °¨ Below 130/80 if you have diabetes or kidney disease. °SEEK IMMEDIATE MEDICAL CARE IF: °· You have pain in your chest, neck, arm, jaw, stomach, or back that lasts more than a few minutes, is recurring, or is not relieved by taking medicine under your tongue (sublingual nitroglycerin). °· You have profuse sweating without cause. °· You have unexplained: °¨ Heartburn or indigestion. °¨ Shortness of breath or difficulty breathing. °¨ Nausea or vomiting. °¨ Fatigue. °¨ Feelings of nervousness or anxiety. °¨ Weakness. °¨ Diarrhea. °· You have sudden light-headedness or dizziness. °· You faint. °These symptoms may represent a serious problem that is an emergency. Do not wait to see if the symptoms will go away. Get medical help right away. Call your local emergency services (911 in the [place] Do not drive yourself to the clinic or hospital. °  °This information is not intended to replace advice given to you by your health care provider. Make sure you discuss any questions you have with your health care provider. °  °Document Released: 08/23/2005 Document Revised: 09/13/2014 Document Reviewed: 12/25/2013 °Elsevier Interactive Patient Education ©2016 [employer]vier Inc[employer] ° ° ° °

## 2015-08-28 NOTE — Care Management Note (Signed)
Case Management Note  Patient Details  Name: Leslie Dorsey MRN: [id number] Date of Birth: 1951-05-28  Subjective/Objective:  Pt admitted for [facility]- plan for home on Brilinta.                  Action/Plan: 30 day free Brilinta card given to pt. Pt uses [facility] and medication is available at zero co pay. No further needs from CM at this time.    Expected Discharge Date:                  Expected Discharge Plan:  Home/Self Care  In-House Referral:  NA  Discharge planning Services  CM Consult, Medication Assistance  Post Acute Care Choice:  NA Choice offered to:  NA  DME Arranged:  N/A DME Agency:  NA  HH Arranged:  NA HH Agency:  NA  Status of Service:  Completed, signed off  Medicare Important Message Given:    Date Medicare IM Given:    Medicare IM give by:    Date Additional Medicare IM Given:    Additional Medicare Important Message give by:     If discussed at Long Length of Stay Meetings, dates discussed:    Additional Comments:  Gala LewandowskyGraves-Bigelow, Ramiyah Mcclenahan Kaye, RN 08/28/2015, 10:11 AM

## 2015-08-28 NOTE — Progress Notes (Signed)
Patient Name: Leslie Dorsey Date of Encounter: 08/28/2015  Primary Cardiologist: Dr. Tresa EndoKelly   Principal Problem:   ST elevation (STEMI) myocardial infarction involving right coronary artery [facility](HCC) Active Problems:   Chest pain at rest   Hypotension   HTN (hypertension)   Tobacco abuse   Anxiety    SUBJECTIVE  Denies any CP or SOB. Feeling well.  CURRENT MEDS . albuterol  3 mL Inhalation Q4H  . aspirin EC  81 mg Oral Daily  . atorvastatin  80 mg Oral q1800  . hydrochlorothiazide  12.5 mg Oral Daily  . losartan  50 mg Oral Daily  . metoprolol tartrate  25 mg Oral BID  . nicotine  14 mg Transdermal Daily  . sodium chloride  3 mL Intravenous Q12H  . ticagrelor  90 mg Oral BID    OBJECTIVE  Filed Vitals:   08/27/15 1132 08/27/15 1509 08/27/15 2000 08/28/15 0400  BP: 149/66 117/78 147/70 120/67  Pulse:  67 71 72  Temp: 98 F (36.7 C) 98 F (36.7 C) 97.5 F (36.4 C) 97.7 F (36.5 C)  TempSrc:  Oral Oral Oral  Resp: 16 15 15 18   Height:    5\' 5"  (1.651 m)  Weight:  187 lb 9.6 oz (85.095 kg)  184 lb 12.8 oz (83.825 kg)  SpO2: 97% 99% 95% 97%    Intake/Output Summary (Last 24 hours) at 08/28/15 0810 Last data filed at 08/27/15 2200  Gross per 24 hour  Intake    460 ml  Output      0 ml  Net    460 ml   Filed Weights   08/27/15 0500 08/27/15 1509 08/28/15 0400  Weight: 187 lb 6.3 oz (85 kg) 187 lb 9.6 oz (85.095 kg) 184 lb 12.8 oz (83.825 kg)    PHYSICAL EXAM  General: Pleasant, NAD. Neuro: Alert and oriented X 3. Moves all extremities spontaneously. Psych: Normal affect. HEENT:  Normal  Neck: Supple without bruits or JVD. Lungs:  Resp regular and unlabored, CTA. Heart: RRR no s3, s4, or murmurs. R femoral cath site stable, without any sign of bleeding, dressing in place. Abdomen: Soft, non-tender, non-distended, BS + x 4.  Extremities: No clubbing, cyanosis or edema. DP/PT/Radials 2+ and equal bilaterally.  Accessory Clinical  Findings  CBC  Recent Labs  08/26/15 1927 08/27/15 1220  WBC 15.3* 13.6*  NEUTROABS 13.1*  --   HGB 14.0 13.2  HCT 41.3 39.4  MCV 92.0 91.8  PLT 275 263   Basic Metabolic Panel  Recent Labs  08/26/15 1927 08/27/15 1220  NA 140 142  K 3.9 3.5  CL 103 104  CO2 28 29  GLUCOSE 109* 104*  BUN 12 7  CREATININE 0.83 0.77  CALCIUM 8.8* 8.9  MG 2.0  --    Liver Function Tests  Recent Labs  08/26/15 1704 08/26/15 1927  AST 18 28  ALT 15 16  ALKPHOS 81 85  BILITOT 0.2* 0.5  PROT 5.9* 6.5  ALBUMIN 3.4* 3.7   Cardiac Enzymes  Recent Labs  08/26/15 1704 08/26/15 1927 08/27/15 0034 08/27/15 1220  CKTOTAL 55  --   --   --   CKMB 1.8  --   --   --   TROPONINI 0.09* 0.67* 2.67* 7.13*   Hemoglobin A1C  Recent Labs  08/26/15 1927  HGBA1C 5.8*   Fasting Lipid Panel  Recent Labs  08/27/15 1220  CHOL 232*  HDL 48  LDLCALC 146*  TRIG 191*  CHOLHDL 4.8   Thyroid Function Tests  Recent Labs  08/26/15 1927  TSH 2.157    TELE NSR without significant ventricular ectopy    ECG  No new EKG  Echocardiogram 08/27/2015  LV EF: 55% -  60%  ------------------------------------------------------------------- Indications:   Chest pain 786.51.  ------------------------------------------------------------------- History:  Risk factors: Hypertension.  ------------------------------------------------------------------- Study Conclusions  - Left ventricle: The cavity size was normal. There was mild concentric hypertrophy. Systolic function was normal. The estimated ejection fraction was in the range of 55% to 60%. Wall motion was normal; there were no regional wall motion abnormalities. Features are consistent with a pseudonormal left ventricular filling pattern, with concomitant abnormal relaxation and increased filling pressure (grade 2 diastolic dysfunction).    Radiology/Studies  Portable Chest X-ray 1 View  08/26/2015   CLINICAL DATA:  Myocardial infarction.  Chest pain EXAM: PORTABLE CHEST 1 VIEW COMPARISON:  Jan 14, 2015. FINDINGS: Lungs are clear. Heart size and pulmonary vascularity are normal. No adenopathy. No pneumothorax. No bone lesions. IMPRESSION: No edema or consolidation. Electronically Signed   By: Bretta Bang III M.D.   On: 08/26/2015 18:47    ASSESSMENT AND PLAN  64 yo female with HTN and tobacco abuse but no prior cardiac hx presented with inferior STEMI  1. Inferior STEMI  - cath 08/26/2015 99% prox RCA lesion with thrombus in prox to mid RCA treated with 3.5x38mm Resolute DES postdilated to 3.54mm, 20% mid to dis LAD lesion, 10% ost LCx lesion, EF 55%  - Echo 08/27/2015 55-60%, grade 2 diastolic dysfunction, no RWMA.   - ASA, Brilinta, lipitor, metoprolol, losartan/HCTZ. Discharge today after seen by cardiac rehab.    2. HTN: stable. Home amlodipine replaced by metoprolol.  3. HLD: chol 230, trig 201, LDL 142, HDL 48. On Lipitor  4. Predibetes: A1C 5.8, need close followup with PCP, encourage diet and exercise  5. Tobacco abuse: nicotine patch, patient says she is ready to quit  Signed, Amedeo Plenty Pager: [phone]  Personally seen and examined. Agree with above. INF STEMI  - RCA stent  - DAPT  - TOB cessation  OK for DC She looks well, ambulating.   Donato Schultz, MD

## 2015-08-28 NOTE — Telephone Encounter (Signed)
New message  tcm  12/30 @ 9:30 a with PA-C Dunn  Per [employer]

## 2015-08-29 NOTE — Telephone Encounter (Signed)
I left a message for the patient to call. 

## 2015-08-29 NOTE — Telephone Encounter (Signed)
Patient contacted regarding discharge from  on 08/28/15.  Patient understands to follow up with provider Ronie Spiesayna Dunn on 09/05/15 at [facility]. Patient understands discharge instructions? yes Patient understands medications and regiment? yes Patient understands to bring all medications to this visit? yes  Pt reports feeling tired despite sleeping well last night.  She states she understands that is a normal occurrence after being in the hospital.

## 2015-09-04 ENCOUNTER — Encounter: Payer: Self-pay | Admitting: Physician Assistant

## 2015-09-05 ENCOUNTER — Encounter: Payer: 59 | Admitting: Physician Assistant

## 2015-09-05 ENCOUNTER — Encounter: Payer: Self-pay | Admitting: Physician Assistant

## 2015-09-05 ENCOUNTER — Ambulatory Visit (INDEPENDENT_AMBULATORY_CARE_PROVIDER_SITE_OTHER): Payer: 59 | Admitting: Physician Assistant

## 2015-09-05 VITALS — BP 124/82 | HR 52 | Ht 65.0 in | Wt 191.8 lb

## 2015-09-05 DIAGNOSIS — R079 Chest pain, unspecified: Secondary | ICD-10-CM

## 2015-09-05 DIAGNOSIS — E785 Hyperlipidemia, unspecified: Secondary | ICD-10-CM

## 2015-09-05 DIAGNOSIS — I2111 ST elevation (STEMI) myocardial infarction involving right coronary artery: Secondary | ICD-10-CM

## 2015-09-05 DIAGNOSIS — I2510 Atherosclerotic heart disease of native coronary artery without angina pectoris: Secondary | ICD-10-CM | POA: Diagnosis not present

## 2015-09-05 DIAGNOSIS — I10 Essential (primary) hypertension: Secondary | ICD-10-CM

## 2015-09-05 MED ORDER — NICOTINE 14 MG/24HR TD PT24: 14.0000 mg | MEDICATED_PATCH | Freq: Every day | TRANSDERMAL | Status: DC

## 2015-09-05 NOTE — Patient Instructions (Addendum)
Medication Instructions:  Your physician recommends that you continue on your current medications as directed. Please refer to the Current Medication list given to you today.   Labwork: Your physician recommends that you return for a FASTING lipid profile: 2 MONTHS LIPIDS & LFT  Testing/Procedures: None ordered  Follow-Up: Your physician recommends that you schedule a follow-up appointment in:  3 MONTHS WITH DR. Tresa EndoKELLY   Any Other Special Instructions Will Be Listed Below (If Applicable).     If you need a refill on your cardiac medications before your next appointment, please call your pharmacy.

## 2015-09-05 NOTE — Progress Notes (Signed)
Cardiology Office Note   Date:  09/05/2015   ID:  Leslie Dorsey, DOB 04-23-1951, MRN [id number]  PCP:  Farris Has, MD  Cardiologist: Dr. Tresa Endo  Chief Complaint  Patient presents with  . Follow-up    post hospital followup, seen for Dr. Tresa Endo  . Coronary Artery Disease      History of Present Illness: Leslie Dorsey is a 64 y.o. female who presents for post hospital TCM followup. The patient is a 65 year old female with PMH of HTN who was recently admitted with inferior STEMI. She underwent emergent cardiac catheterization on 08/26/2015 which showed a 99% prox RCA lesion with thrombus in prox to mid RCA treated with 3.5x70mm Resolute DES postdilated to 3.23mm, 20% mid to dis LAD lesion, 10% ost LCx lesion, EF 55%. Echocardiogram obtained on the following day showed EF 55-60%, grade 2 diastolic dysfunction, no RWMA. Post cath, she was started on aspirin and Brilinta along with Lipitor and metoprolol and home losartan/hydrochlorothiazide. Home amlodipine has been discontinued. Lipid panel obtained showed cholesterol 230, triglycerides 191, LDL 142, HDL 48. Hemoglobin A1c was in prediabetes level of 5.8.  She presents today for post hospital TCM followup. Her PCP has stopped her losartan/HCTZ and changed to losartan  daily to avoid hypotension. She has not started on losartan. Her BP today is 124/82. She denies any swelling since cath. She is doing very well after starting on nicotine patch, she has not used cigarette since left the hospital. I congratulated her on this. She says because how she is doing, her husband is also looking into nicotine patch to help him quit as well. She has been compliant with DAPT since discharge. She still has some bruising over the R thigh, but no pain. She did have what she feels like pulled muscle over the left shoulder. She also has an episode of her usual acid flux symptom at night. It has not recurred. She has began to start lifting 5 lbs objects and  trying to increase her activity level, she has not noticed obvious angina. She denies any diffuse muscle ache after starting lipitor.     Past Medical History  Diagnosis Date  . Essential hypertension   . ST elevation (STEMI) myocardial infarction involving right coronary artery (HCC)   . CAD (coronary artery disease)     a. Inf STEMI 08/2015 - inferior STEMI 08/2015 s/p DES to RCA.  . Tobacco abuse   . Obesity   . Hyperlipidemia   . Hyperglycemia     a. 08/2015: A1C 5.8.    Past Surgical History  Procedure Laterality Date  . Abdominal hysterectomy    . Hemorroidectomy    . Cardiac catheterization N/A 08/26/2015    Procedure: Left Heart Cath and Coronary Angiography;  Surgeon: Lennette Bihari, MD;  Location: [facility] INVASIVE CV LAB;  Service: Cardiovascular;  Laterality: N/A;  . Cardiac catheterization N/A 08/26/2015    Procedure: Coronary Stent Intervention;  Surgeon: Lennette Bihari, MD;  Location: [facility] INVASIVE CV LAB;  Service: Cardiovascular;  Laterality: N/A;     Current Outpatient Prescriptions  Medication Sig Dispense Refill  . aspirin EC 81 MG EC tablet Take 1 tablet (81 mg total) by mouth daily.    Marland Kitchen atorvastatin (LIPITOR) 80 MG tablet Take 1 tablet (80 mg total) by mouth daily at 6 PM. 90 tablet 3  . clonazePAM (KLONOPIN) 1 MG tablet Take 1 mg by mouth daily as needed for anxiety.   1  . Cyanocobalamin (VITAMIN  B-12 PO) Take 1 tablet by mouth daily at 12 noon.    . cyclobenzaprine (FLEXERIL) 10 MG tablet Take 5 mg by mouth daily as needed for muscle spasms.   0  . KRILL OIL PO Take 1 tablet by mouth daily at 12 noon.    Marland Kitchen. losartan (COZAAR) 50 MG tablet Take 50 mg by mouth daily.    . metoprolol tartrate (LOPRESSOR) 25 MG tablet Take 1 tablet (25 mg total) by mouth 2 (two) times daily. 180 tablet 3  . nicotine (NICODERM CQ - DOSED IN MG/24 HOURS) 14 mg/24hr patch Place 1 patch (14 mg total) onto the skin daily. 28 patch 2  . nitroGLYCERIN (NITROSTAT) 0.4 MG SL tablet Place 1  tablet (0.4 mg total) under the tongue every 5 (five) minutes x 3 doses as needed for chest pain. 25 tablet 3  . Pyridoxine HCl (VITAMIN B-6 PO) Take 1 tablet by mouth daily at 12 noon.    . ticagrelor (BRILINTA) 90 MG TABS tablet Take 1 tablet (90 mg total) by mouth 2 (two) times daily. 180 tablet 3   No current facility-administered medications for this visit.    Allergies:   Morphine and related and Levofloxacin    Social History:  The patient  reports that she has been smoking.  She does not have any smokeless tobacco history on file. She reports that she does not drink alcohol or use illicit drugs.   Family History:  The patient's family history includes Heart attack in her mother; Heart disease in her mother.    ROS:  Please see the history of present illness.   Otherwise, review of systems are positive for occasional L shoulder ache.   All other systems are reviewed and negative.    PHYSICAL EXAM: VS:  BP 124/82 mmHg  Pulse 52  Ht 5\' 5"  (1.651 m)  Wt 191 lb 12.8 oz (87 kg)  BMI 31.92 kg/m2 , BMI Body mass index is 31.92 kg/(m^2). GEN: Well nourished, well developed, in no acute distress HEENT: normal Neck: no JVD, carotid bruits, or masses Cardiac: RRR; no murmurs, rubs, or gallops,no edema  Respiratory:  clear to auscultation bilaterally, normal work of breathing GI: soft, nontender, nondistended, + BS MS: no deformity or atrophy Skin: warm and dry, no rash Neuro:  Strength and sensation are intact Psych: euthymic mood, full affect   EKG:  EKG is ordered today. The ekg ordered today demonstrates sinus bradycardia without significant ST-T wave changes   Recent Labs: 08/26/2015: ALT 16; B Natriuretic Peptide 51.1; Magnesium 2.0; TSH 2.157 08/27/2015: BUN 7; Creatinine, Ser 0.77; Hemoglobin 13.2; Platelets 263; Potassium 3.5; Sodium 142    Lipid Panel    Component Value Date/Time   CHOL 232* 08/27/2015 1220   TRIG 191* 08/27/2015 1220   HDL 48 08/27/2015 1220    CHOLHDL 4.8 08/27/2015 1220   VLDL 38 08/27/2015 1220   LDLCALC 146* 08/27/2015 1220      Wt Readings from Last 3 Encounters:  09/05/15 191 lb 12.8 oz (87 kg)  08/28/15 184 lb 12.8 oz (83.825 kg)  08/06/14 184 lb (83.462 kg)      Other studies Reviewed: Additional studies/ records that were reviewed today include:    Echocardiogram 08/27/2015 LV EF: 55% -  60%  ------------------------------------------------------------------- Indications:   Chest pain 786.51.  ------------------------------------------------------------------- History:  Risk factors: Hypertension.  ------------------------------------------------------------------- Study Conclusions  - Left ventricle: The cavity size was normal. There was mild concentric hypertrophy. Systolic function was normal. The  estimated ejection fraction was in the range of 55% to 60%. Wall motion was normal; there were no regional wall motion abnormalities. Features are consistent with a pseudonormal left ventricular filling pattern, with concomitant abnormal relaxation and increased filling pressure (grade 2 diastolic dysfunction).    Cardiac catheterization 08/26/2015     Conclusion     Ost Cx lesion, 10% stenosed.  Mid LAD to Dist LAD lesion, 20% stenosed.  Prox RCA lesion, 99% stenosed. Post intervention, there is a 0% residual stenosis.  The left ventricular systolic function is normal.  Acute inferior ST segment elevation myocardial infarction secondary to subtotal 99% RCA stenosis with thrombus in the proximal to midsegment and a large dominant RCA.  Mild concomitant nonobstructive CAD with smooth 20% narrowing in the LAD, and 10% ostial narrowing in the left circumflex coronary artery.  Preserved LV function with an acute ejection fraction of 55%.  Successful PCI to a dominant RCA with a 99% stenosis being reduced to 0% with ultimate insertion of a 3.515 mm Resolute DES stent  postdilated to 3.70 with 99% stenosis reduced to 0% and brisk TIMI-3 flow.  RECOMMENDATION: Patient will be maintained on dual antiplatelet therapy for minimum of a year. She will be started on very low dose beta blocker therapy and Ace inhibition as blood pressure allows. Aggressive statin intervention will be necessary. Smoking cessation is essential.           Review of the above records demonstrates:   Recent initial care of inferior STEMI, no prior cardiac history. Cath showed single vessel CAD, s/p DES to prox RCA. Echo shows normal function. Lipid panel shows hyperlipidemia, started on lipitor   ASSESSMENT AND PLAN:  1. CAD s/p inferior STEMI and DES to prox RCA  - Cath 08/25/2015 99% prox RCA lesion with thrombus in prox to mid RCA treated with 3.5x67mm Resolute DES postdilated to 3.85mm, 20% mid to dis LAD lesion, 10% ost LCx lesion, EF 55%  - Echocardiogram 08/26/2015 EF 55-60%, grade 2 diastolic dysfunction, no regional wall motion abnormality  2. HTN: well controlled. PCP changed hyzaar to losartan  daily. Will continue monitoring her BP. She does have some bradycardia with HR 50s on metoprolol, however she denies any persistent fatigue or dizziness, will continue current dose however if she becomes symptomatic in the future, will cut back metoprolol to 12.5mg  BID and restart her previous hyzaar  3. HLD: last lipid panel 08/27/2015 Chol 232, trig 191, HDL 48, LDL 146. Continue high dose lipitor. Obtain fasting Lipid panel and LFT in 2 month  4. Tobacco abuse: she not only quit smoking since heart attack, her husband is thinking about quiting as well. I have given her another Rx for nicotine patch.     Current medicines are reviewed at length with the patient today.  The patient does not have concerns regarding medicines.  The following changes have been made:  no change  Labs/ tests ordered today include:   Orders Placed This Encounter  Procedures  . Lipid  panel  . Hepatic function panel  . EKG 12-Lead     Disposition:   FU with Dr. Tresa Endo in 3 months  Signed, Azalee Course PA 09/05/2015 2:30 PM    [facility] [address] [place], [place], [place]  [address] Phone: [phone]; Fax: [phone]

## 2015-09-10 ENCOUNTER — Ambulatory Visit
Admission: RE | Admit: 2015-09-10 | Discharge: 2015-09-10 | Disposition: A | Discharge status: Home / Self Care | Payer: 59 | Source: Ambulatory Visit | Attending: Family Medicine | Admitting: Family Medicine

## 2015-09-10 ENCOUNTER — Other Ambulatory Visit: Payer: Self-pay | Admitting: Family Medicine

## 2015-09-10 DIAGNOSIS — R0602 Shortness of breath: Secondary | ICD-10-CM

## 2015-09-19 ENCOUNTER — Telehealth: Payer: Self-pay | Admitting: *Deleted

## 2015-09-19 NOTE — Telephone Encounter (Signed)
Faxed phase 2 cardiac rehab order to [place]. 

## 2015-09-26 ENCOUNTER — Observation Stay (HOSPITAL_COMMUNITY)
Admission: EM | Admit: 2015-09-26 | Discharge: 2015-09-28 | Disposition: A | Discharge status: Home / Self Care | Payer: 59 | Attending: Internal Medicine | Admitting: Internal Medicine

## 2015-09-26 ENCOUNTER — Emergency Department (HOSPITAL_COMMUNITY): Payer: 59

## 2015-09-26 ENCOUNTER — Encounter (HOSPITAL_COMMUNITY): Payer: Self-pay | Admitting: Emergency Medicine

## 2015-09-26 DIAGNOSIS — Z87891 Personal history of nicotine dependence: Secondary | ICD-10-CM

## 2015-09-26 DIAGNOSIS — I252 Old myocardial infarction: Secondary | ICD-10-CM | POA: Insufficient documentation

## 2015-09-26 DIAGNOSIS — F17200 Nicotine dependence, unspecified, uncomplicated: Secondary | ICD-10-CM | POA: Diagnosis not present

## 2015-09-26 DIAGNOSIS — R0600 Dyspnea, unspecified: Secondary | ICD-10-CM

## 2015-09-26 DIAGNOSIS — Z955 Presence of coronary angioplasty implant and graft: Secondary | ICD-10-CM | POA: Insufficient documentation

## 2015-09-26 DIAGNOSIS — I2510 Atherosclerotic heart disease of native coronary artery without angina pectoris: Secondary | ICD-10-CM

## 2015-09-26 DIAGNOSIS — I10 Essential (primary) hypertension: Secondary | ICD-10-CM | POA: Diagnosis not present

## 2015-09-26 DIAGNOSIS — R079 Chest pain, unspecified: Secondary | ICD-10-CM | POA: Diagnosis present

## 2015-09-26 DIAGNOSIS — R0789 Other chest pain: Principal | ICD-10-CM | POA: Diagnosis present

## 2015-09-26 DIAGNOSIS — E669 Obesity, unspecified: Secondary | ICD-10-CM | POA: Diagnosis not present

## 2015-09-26 DIAGNOSIS — E785 Hyperlipidemia, unspecified: Secondary | ICD-10-CM | POA: Insufficient documentation

## 2015-09-26 DIAGNOSIS — Z7982 Long term (current) use of aspirin: Secondary | ICD-10-CM | POA: Insufficient documentation

## 2015-09-26 DIAGNOSIS — F419 Anxiety disorder, unspecified: Secondary | ICD-10-CM | POA: Diagnosis present

## 2015-09-26 LAB — BASIC METABOLIC PANEL
ANION GAP: 12 (ref 5–15)
BUN: 17 mg/dL (ref 6–20)
CHLORIDE: 105 mmol/L (ref 101–111)
CO2: 25 mmol/L (ref 22–32)
CREATININE: 0.93 mg/dL (ref 0.44–1.00)
Calcium: 9.2 mg/dL (ref 8.9–10.3)
GFR calc non Af Amer: >60 mL/min (ref >60)
Glucose, Bld: 108 mg/dL — ABNORMAL HIGH (ref 65–99)
POTASSIUM: 4.3 mmol/L (ref 3.5–5.1)
SODIUM: 142 mmol/L (ref 135–145)

## 2015-09-26 LAB — DIFFERENTIAL
Basophils Absolute: 0 10*3/uL (ref 0.0–0.1)
Basophils Relative: 0 %
EOS PCT: 3 %
Eosinophils Absolute: 0.3 10*3/uL (ref 0.0–0.7)
LYMPHS ABS: 2.9 10*3/uL (ref 0.7–4.0)
Lymphocytes Relative: 32 %
MONO ABS: 0.6 10*3/uL (ref 0.1–1.0)
Monocytes Relative: 7 %
NEUTROS ABS: 5.1 10*3/uL (ref 1.7–7.7)
Neutrophils Relative %: 58 %

## 2015-09-26 LAB — CBC
HEMATOCRIT: 36.7 % (ref 36.0–46.0)
HEMOGLOBIN: 12.3 g/dL (ref 12.0–15.0)
MCH: 30.4 pg (ref 26.0–34.0)
MCHC: 33.5 g/dL (ref 30.0–36.0)
MCV: 90.8 fL (ref 78.0–100.0)
Platelets: 256 10*3/uL (ref 150–400)
RBC: 4.04 MIL/uL (ref 3.87–5.11)
RDW: 13.1 % (ref 11.5–15.5)
WBC: 8.9 10*3/uL (ref 4.0–10.5)

## 2015-09-26 LAB — APTT: aPTT: 30 seconds (ref 24–37)

## 2015-09-26 LAB — PROTIME-INR
INR: 1.05 (ref 0.00–1.49)
PROTHROMBIN TIME: 13.9 s (ref 11.6–15.2)

## 2015-09-26 LAB — I-STAT TROPONIN, ED: Troponin i, poc: 0.01 ng/mL (ref 0.00–0.08)

## 2015-09-26 NOTE — ED Notes (Signed)
Prolux, MD at bedside.

## 2015-09-26 NOTE — ED Notes (Signed)
Patient transported to X-ray 

## 2015-09-26 NOTE — H&P (Signed)
CARDIOLOGY INPATIENT HISTORY AND PHYSICAL EXAMINATION NOTE  Patient ID: Leslie Dorsey MRN: [id number], DOB/AGE: May 22, 1951   Admit date: 09/26/2015   Primary Physician: Farris Has, MD Primary Cardiologist: Nicki Guadalajara MD/Meng Wynema Birch PA  Reason for admission: chest pain r/o ACS  HPI: This is a 65 y.o.white female with prior history of CAD (w/recent inferior STEMI with TIMI 3 flow, HTN, tobacco dependence was admitted from12/20 - 12/22 presented with chest pain.  Patient started having chest pain today in the evening around 7 pm after taking a bath which was pressure like sensation in the middle of the chest. The chest pain improved with NTG. She felt that the pressure was similar to her prior MI. It was 10/10 in intensity. She was having difficulty breathing. She has been walking and have not been to cardiac rehab. She has quit smoking and hasnt smoked since 12/20.  Her first troponin x 1 was negative. She was compliant with her DAPT at home. She SOB while standing up but laying down she is fine.  She is also describing a second pain that her shoulder was hurting and the right sided rib case is also painful. She also has tenderness under the left arm. She never had an EGD performed.   Cardiac data:  EKG today sinus bradycardia, 59 bpm, normal axis, no ST/T wave changes, normal R wave progression Echo 08/27/2015: mild LVH, normal LV size, preserved LVEF 60%, grade 2 diastolic dysfunction, no valvular disease Cardiac cath 08/26/2015: 99% prox RCA lesion with thrombus in prox to mid RCA treated with 3.5x67mm Resolute DES postdilated to 3.10mm, 20% mid to distal LAD lesion, 10% ostial LCx lesion, EF 55%.   Problem List: Past Medical History  Diagnosis Date  . Essential hypertension   . ST elevation (STEMI) myocardial infarction involving right coronary artery (HCC)   . CAD (coronary artery disease)     a. Inf STEMI 08/2015 - inferior STEMI 08/2015 s/p DES to RCA.  . Tobacco abuse   .  Obesity   . Hyperlipidemia   . Hyperglycemia     a. 08/2015: A1C 5.8.    Past Surgical History  Procedure Laterality Date  . Abdominal hysterectomy    . Hemorroidectomy    . Cardiac catheterization N/A 08/26/2015    Procedure: Left Heart Cath and Coronary Angiography;  Surgeon: Lennette Bihari, MD;  Location: [facility] INVASIVE CV LAB;  Service: Cardiovascular;  Laterality: N/A;  . Cardiac catheterization N/A 08/26/2015    Procedure: Coronary Stent Intervention;  Surgeon: Lennette Bihari, MD;  Location: [facility] INVASIVE CV LAB;  Service: Cardiovascular;  Laterality: N/A;     Allergies:  Allergies  Allergen Reactions  . Morphine And Related Anaphylaxis  . Levofloxacin Rash     Home Medications No current facility-administered medications for this encounter.   Current Outpatient Prescriptions  Medication Sig Dispense Refill  . albuterol (PROVENTIL HFA;VENTOLIN HFA) 108 (90 Base) MCG/ACT inhaler Inhale 1-2 puffs into the lungs every 6 (six) hours as needed for wheezing or shortness of breath.    Marland Kitchen aspirin EC 81 MG EC tablet Take 1 tablet (81 mg total) by mouth daily.    Marland Kitchen atorvastatin (LIPITOR) 80 MG tablet Take 1 tablet (80 mg total) by mouth daily at 6 PM. 90 tablet 3  . clonazePAM (KLONOPIN) 1 MG tablet Take 1 mg by mouth daily as needed for anxiety.   1  . Cyanocobalamin (VITAMIN B-12 PO) Take 1 tablet by mouth daily at 12 noon.    Marland Kitchen  cyclobenzaprine (FLEXERIL) 10 MG tablet Take 5 mg by mouth daily as needed for muscle spasms.   0  . losartan-hydrochlorothiazide (HYZAAR) 50-12.5 MG tablet Take 1 tablet by mouth daily.    . metoprolol tartrate (LOPRESSOR) 25 MG tablet Take 1 tablet (25 mg total) by mouth 2 (two) times daily. 180 tablet 3  . nicotine (NICODERM CQ - DOSED IN MG/24 HOURS) 14 mg/24hr patch Place 1 patch (14 mg total) onto the skin daily. 28 patch 2  . nitroGLYCERIN (NITROSTAT) 0.4 MG SL tablet Place 1 tablet (0.4 mg total) under the tongue every 5 (five) minutes x 3 doses as needed  for chest pain. 25 tablet 3  . ticagrelor (BRILINTA) 90 MG TABS tablet Take 1 tablet (90 mg total) by mouth 2 (two) times daily. 180 tablet 3     Family History  Problem Relation Age of Onset  . Heart disease Mother   . Heart attack Mother      Social History   Social History  . Marital Status: Married    Spouse Name: N/A  . Number of Children: N/A  . Years of Education: N/A   Occupational History  . Not on file.   Social History Main Topics  . Smoking status: Current Every Day Smoker  . Smokeless tobacco: Not on file  . Alcohol Use: No  . Drug Use: No  . Sexual Activity: Not on file   Other Topics Concern  . Not on file   Social History Narrative     Review of Systems: General: negative for chills, fever, night sweats or weight changes.  Cardiovascular: chest pain, dyspnea negative for dyspnea on exertion, edema, orthopnea, palpitations, paroxysmal nocturnal dyspnea or  Dermatological: negative for rash Respiratory: negative for cough or wheezing Urologic: negative for hematuria Abdominal: negative for nausea, vomiting, diarrhea, bright red blood per rectum, melena, or hematemesis Neurologic: negative for visual changes, syncope, or dizziness Endocrine: no diabetes, no hypothyroidism Immunological: no lymph adenopathy Psych: non homicidal/suicidal  Physical Exam: Vitals: BP 146/76 mmHg  Pulse 60  Temp(Src) 98.5 F (36.9 C) (Oral)  Resp 20  SpO2 96% General: not in acute distress Neck: JVP flat, neck supple Heart: regular rate and rhythm, S1, S2, no murmurs  Chest wall: tenderness on the left sided of the chest, not in the area of the initial chest pain  Lungs: CTAB  GI: non tender, non distended, bowel sounds present Extremities: no edema Neuro: AAO x 3  Psych: normal affect, no anxiety   Labs:   Results for orders placed or performed during the hospital encounter of 09/26/15 (from the past 24 hour(s))  CBC     Status: None   Collection Time:  09/26/15  9:09 PM  Result Value Ref Range   WBC 8.9 4.0 - 10.5 K/uL   RBC 4.04 3.87 - 5.11 MIL/uL   Hemoglobin 12.3 12.0 - 15.0 g/dL   HCT 16.1 09.6 - 04.5 %   MCV 90.8 78.0 - 100.0 fL   MCH 30.4 26.0 - 34.0 pg   MCHC 33.5 30.0 - 36.0 g/dL   RDW 40.9 81.1 - 91.4 %   Platelets 256 150 - 400 K/uL  Differential     Status: None   Collection Time: 09/26/15  9:09 PM  Result Value Ref Range   Neutrophils Relative % 58 %   Neutro Abs 5.1 1.7 - 7.7 K/uL   Lymphocytes Relative 32 %   Lymphs Abs 2.9 0.7 - 4.0 K/uL   Monocytes Relative  7 %   Monocytes Absolute 0.6 0.1 - 1.0 K/uL   Eosinophils Relative 3 %   Eosinophils Absolute 0.3 0.0 - 0.7 K/uL   Basophils Relative 0 %   Basophils Absolute 0.0 0.0 - 0.1 K/uL  Protime-INR     Status: None   Collection Time: 09/26/15  9:09 PM  Result Value Ref Range   Prothrombin Time 13.9 11.6 - 15.2 seconds   INR 1.05 0.00 - 1.49  APTT     Status: None   Collection Time: 09/26/15  9:09 PM  Result Value Ref Range   aPTT 30 24 - 37 seconds  Basic metabolic panel     Status: Abnormal   Collection Time: 09/26/15  9:09 PM  Result Value Ref Range   Sodium 142 135 - 145 mmol/L   Potassium 4.3 3.5 - 5.1 mmol/L   Chloride 105 101 - 111 mmol/L   CO2 25 22 - 32 mmol/L   Glucose, Bld 108 (H) 65 - 99 mg/dL   BUN 17 6 - 20 mg/dL   Creatinine, Ser 1.61 0.44 - 1.00 mg/dL   Calcium 9.2 8.9 - 09.6 mg/dL   GFR calc non Af Amer >60 >60 mL/min   GFR calc Af Amer >60 >60 mL/min   Anion gap 12 5 - 15  I-stat troponin, ED (0, 3, 6 hours) (not at [facility])     Status: None   Collection Time: 09/26/15  9:11 PM  Result Value Ref Range   Troponin i, poc 0.01 0.00 - 0.08 ng/mL   Comment 3             Radiology/Studies: Dg Chest 2 View  09/26/2015  CLINICAL DATA:  Central chest pain radiating outward, severe EXAM: CHEST  2 VIEW COMPARISON:  09/10/2015 FINDINGS: The heart size and vascular pattern are normal. No consolidation or effusion. No pneumothorax. IMPRESSION:  No active cardiopulmonary disease. Electronically Signed   By: Esperanza Heir M.D.   On: 09/26/2015 21:33   Dg Chest 2 View  09/10/2015  CLINICAL DATA:  Shortness breath. EXAM: CHEST  2 VIEW COMPARISON:  08/26/2015. FINDINGS: Mediastinum and hilar structures are normal. Lungs are clear. Heart size normal. No pleural effusion or pneumothorax. IMPRESSION: No acute cardiopulmonary disease. Electronically Signed   By: Maisie Fus  Register   On: 09/10/2015 12:09    EKG today sinus bradycardia, 59 bpm, normal axis, no ST/T wave changes, normal R wave progression Echo 08/27/2015: mild LVH, normal LV size, preserved LVEF 60%, grade 2 diastolic dysfunction, no valvular disease Cardiac cath 08/26/2015: 99% prox RCA lesion with thrombus in prox to mid RCA treated with 3.5x63mm Resolute DES postdilated to 3.42mm, 20% mid to dis LAD lesion, 10% ost LCx lesion, EF 55%.   Medical decision making:  Discussed care with the patient Discussed care with the physician on the phone Reviewed labs and imaging personally Reviewed prior records  ASSESSMENT AND PLAN:  This is a 65 y.o. female with prior history of CAD (w/recent inferior STEMI with TIMI 3 flow, HTN, tobacco dependence was admitted from12/20 - 12/22 presented with chest pain.    Active Problems:   * No active hospital problems. *  Atypical cardiac chest pain, likely post MI pericarditis, differential includes GERD Admit for observation Cycle troponin Serial EKGs NPO post midnight  Aspirin and lipitor 80 mg daily  Will give colchicine 0.6 mg BID and aspirin 650 mg TID for 1-2 weeks (treat for Dressler's syndrome) If CRP negative, will treat with starting imdur  tomorrow Echocardiogram to evaluate for pericardial effusion   Hypertension, essential Continue metoprolol, hctz, losartan Goal <140/90  Tobacco abuse Quit 1 moth ago Counseled for tobacco use cessation   Signed, Joellyn Rued, MD MS 09/26/2015, 10:22 PM

## 2015-09-26 NOTE — ED Provider Notes (Signed)
CSN: [id number]     Arrival date & time 09/26/15  2044 History   First MD Initiated Contact with Patient 09/26/15 2047     Chief Complaint  Patient presents with  . Chest Pain  . Fatigue  . Pallor     (Consider location/radiation/quality/duration/timing/severity/associated sxs/prior Treatment) HPI this is a 65 year old female with a past past medical history of STEMI in December where she had a DES to the RCA comes in today because of chest pain. Her chest pain today started at rest and feels like central chest pain she describes a feeling of fullness. The pain escalated over a period of 5 minutes to 10/10.  She took nitroglycerin which slowly brought the pain down she now has just a lingering discomfort. She is no recent illnesses, no fevers chills. She says this pain feels similar to when she had her stemi  Past Medical History  Diagnosis Date  . Essential hypertension   . ST elevation (STEMI) myocardial infarction involving right coronary artery (HCC)   . CAD (coronary artery disease)     a. Inf STEMI 08/2015 - inferior STEMI 08/2015 s/p DES to RCA.  . Tobacco abuse   . Obesity   . Hyperlipidemia   . Hyperglycemia     a. 08/2015: A1C 5.8.   Past Surgical History  Procedure Laterality Date  . Abdominal hysterectomy    . Hemorroidectomy    . Cardiac catheterization N/A 08/26/2015    Procedure: Left Heart Cath and Coronary Angiography;  Surgeon: Lennette Bihari, MD;  [facility] INVASIVE CV LAB;  Service: Cardiovascular;  Laterality: N/A;  . Cardiac catheterization N/A 08/26/2015    Procedure: Coronary Stent Intervention;  Surgeon: Lennette Bihari, MD;  Location: [facility] INVASIVE CV LAB;  Service: Cardiovascular;  Laterality: N/A;   Family History  Problem Relation Age of Onset  . Heart disease Mother   . Heart attack Mother    Social History  Substance Use Topics  . Smoking status: Current Every Day Smoker  . Smokeless tobacco: None  . Alcohol Use: No   OB History    No data  available     Review of Systems  Constitutional: Negative for fever and chills.  HENT: Negative for nosebleeds.   Eyes: Negative for visual disturbance.  Respiratory: Negative for cough and shortness of breath.   Cardiovascular: Positive for chest pain.  Gastrointestinal: Negative for nausea, vomiting, abdominal pain, diarrhea and constipation.  Genitourinary: Negative for dysuria.  Skin: Negative for rash.  Neurological: Negative for weakness.  All other systems reviewed and are negative.     Allergies  Morphine and related and Levofloxacin  Home Medications   Prior to Admission medications   Medication Sig Start Date End Date Taking? Authorizing Provider  albuterol (PROVENTIL HFA;VENTOLIN HFA) 108 (90 Base) MCG/ACT inhaler Inhale 1-2 puffs into the lungs every 6 (six) hours as needed for wheezing or shortness of breath.   Yes Historical Provider, MD  aspirin EC 81 MG EC tablet Take 1 tablet (81 mg total) by mouth daily. 08/28/15  Yes Azalee Course, PA  atorvastatin (LIPITOR) 80 MG tablet Take 1 tablet (80 mg total) by mouth daily at 6 PM. 08/28/15  Yes Azalee Course, PA  clonazePAM (KLONOPIN) 1 MG tablet Take 1 mg by mouth daily as needed for anxiety.  08/12/15  Yes Historical Provider, MD  Cyanocobalamin (VITAMIN B-12 PO) Take 1 tablet by mouth daily at 12 noon.   Yes Historical Provider, MD  cyclobenzaprine (FLEXERIL) 10 MG  tablet Take 5 mg by mouth daily as needed for muscle spasms.  05/28/15  Yes Historical Provider, MD  losartan-hydrochlorothiazide (HYZAAR) 50-12.5 MG tablet Take 1 tablet by mouth daily.   Yes Historical Provider, MD  metoprolol tartrate (LOPRESSOR) 25 MG tablet Take 1 tablet (25 mg total) by mouth 2 (two) times daily. 08/28/15  Yes Azalee Course, PA  nicotine (NICODERM CQ - DOSED IN MG/24 HOURS) 14 mg/24hr patch Place 1 patch (14 mg total) onto the skin daily. 09/05/15  Yes Azalee Course, PA  nitroGLYCERIN (NITROSTAT) 0.4 MG SL tablet Place 1 tablet (0.4 mg total) under the  tongue every 5 (five) minutes x 3 doses as needed for chest pain. 08/28/15  Yes Azalee Course, PA  ticagrelor (BRILINTA) 90 MG TABS tablet Take 1 tablet (90 mg total) by mouth 2 (two) times daily. 08/28/15  Yes Hao Meng, PA   BP 167/71 mmHg  Pulse 60  Temp(Src) 98.5 F (36.9 C) (Oral)  Resp 21  Ht  (1.651 m)  Wt 86.637 kg  BMI 31.78 kg/m2  SpO2 96% Physical Exam  Constitutional: She is oriented to person, place, and time. No distress.  HENT:  Head: Normocephalic and atraumatic.  Eyes: EOM are normal. Pupils are equal, round, and reactive to light.  Neck: Normal range of motion. Neck supple.  Cardiovascular: Normal rate and intact distal pulses.   Pulmonary/Chest: No respiratory distress.  Abdominal: Soft. There is no tenderness.  Musculoskeletal: Normal range of motion.  Neurological: She is alert and oriented to person, place, and time.  Skin: No rash noted. She is not diaphoretic.  Psychiatric: She has a normal mood and affect.    ED Course  Procedures (including critical care time) Labs Review Labs Reviewed  BASIC METABOLIC PANEL - Abnormal; Notable for the following:    Glucose, Bld 108 (*)    All other components within normal limits  CBC  DIFFERENTIAL  PROTIME-INR  APTT  I-STAT TROPOININ, ED    Imaging Review Dg Chest 2 View  09/26/2015  CLINICAL DATA:  Central chest pain radiating outward, severe EXAM: CHEST  2 VIEW COMPARISON:  09/10/2015 FINDINGS: The heart size and vascular pattern are normal. No consolidation or effusion. No pneumothorax. IMPRESSION: No active cardiopulmonary disease. Electronically Signed   By: Esperanza Heir M.D.   On: 09/26/2015 21:33   I have personally reviewed and evaluated these images and lab results as part of my medical decision-making.   EKG Interpretation None      MDM   Final diagnoses:  Chest pain, unspecified chest pain type    65 year old female with past medical history of coronary artery disease who presents with  chest pain.  Vital signs stable, no tachycardia, and no hypoxia, no leg swelling, doubt PE. EKG unremarkable without new evidence of ischemia. Concern for ACS, will obtain CBC, BMP, troponin, chest x-ray.  CBC and BMP unremarkable. Troponin negative. Chest x-ray unremarkable.  Patient's pain-free at this time will admit to cardiology for chest pain rule out     Silas Flood, MD 09/26/15 2328  Courteney Randall An, MD 09/26/15 2356

## 2015-09-26 NOTE — ED Notes (Signed)
Pt presents from home with [facility] for centralized CP that radiates outward that had a sudden sharp onset while bathing (pain 10/10); pt reports taking  ASA and 1 NTG which brought pain to 3/10; pt reports STEMI on 08/26/2015 with PCI and stent placement; pt reports she stopped smoking at that time; EMS arrived onscene and gave an additional NTG (2 tabs total) which brought pain to 0/10; pt also reports a "warm feeling" in the epigastric area but denies nausea; EMS reports pallor enroute and increasing lethargy since leaving scene; pt CAOx4 at this time and hypertensive

## 2015-09-27 DIAGNOSIS — R0789 Other chest pain: Secondary | ICD-10-CM | POA: Diagnosis not present

## 2015-09-27 DIAGNOSIS — Z87891 Personal history of nicotine dependence: Secondary | ICD-10-CM | POA: Diagnosis not present

## 2015-09-27 DIAGNOSIS — Z720 Tobacco use: Secondary | ICD-10-CM

## 2015-09-27 DIAGNOSIS — I2510 Atherosclerotic heart disease of native coronary artery without angina pectoris: Secondary | ICD-10-CM | POA: Diagnosis not present

## 2015-09-27 DIAGNOSIS — R079 Chest pain, unspecified: Secondary | ICD-10-CM | POA: Diagnosis not present

## 2015-09-27 DIAGNOSIS — I10 Essential (primary) hypertension: Secondary | ICD-10-CM

## 2015-09-27 LAB — TROPONIN I: Troponin I: <0.03 ng/mL (ref <0.031)

## 2015-09-27 LAB — COMPREHENSIVE METABOLIC PANEL
ALBUMIN: 3.4 g/dL — AB (ref 3.5–5.0)
ALT: 27 U/L (ref 14–54)
AST: 25 U/L (ref 15–41)
Alkaline Phosphatase: 88 U/L (ref 38–126)
Anion gap: 9 (ref 5–15)
BILIRUBIN TOTAL: 0.5 mg/dL (ref 0.3–1.2)
BUN: 17 mg/dL (ref 6–20)
CALCIUM: 9 mg/dL (ref 8.9–10.3)
CO2: 27 mmol/L (ref 22–32)
Chloride: 107 mmol/L (ref 101–111)
Creatinine, Ser: 1.02 mg/dL — ABNORMAL HIGH (ref 0.44–1.00)
GFR calc Af Amer: >60 mL/min (ref >60)
GFR calc non Af Amer: 56 mL/min — ABNORMAL LOW (ref >60)
GLUCOSE: 145 mg/dL — AB (ref 65–99)
POTASSIUM: 3.5 mmol/L (ref 3.5–5.1)
SODIUM: 143 mmol/L (ref 135–145)
TOTAL PROTEIN: 6.3 g/dL — AB (ref 6.5–8.1)

## 2015-09-27 LAB — CBC
HEMATOCRIT: 36.1 % (ref 36.0–46.0)
HEMOGLOBIN: 12.1 g/dL (ref 12.0–15.0)
MCH: 30.4 pg (ref 26.0–34.0)
MCHC: 33.5 g/dL (ref 30.0–36.0)
MCV: 90.7 fL (ref 78.0–100.0)
Platelets: 259 10*3/uL (ref 150–400)
RBC: 3.98 MIL/uL (ref 3.87–5.11)
RDW: 13.3 % (ref 11.5–15.5)
WBC: 8.6 10*3/uL (ref 4.0–10.5)

## 2015-09-27 LAB — TSH: TSH: 3.079 u[IU]/mL (ref 0.350–4.500)

## 2015-09-27 LAB — GLUCOSE, CAPILLARY: Glucose-Capillary: 107 mg/dL — ABNORMAL HIGH (ref 65–99)

## 2015-09-27 LAB — C-REACTIVE PROTEIN: CRP: 0.5 mg/dL (ref <1.0)

## 2015-09-27 MED ORDER — SODIUM CHLORIDE 0.9 % IJ SOLN
3.0000 mL | Freq: Two times a day (BID) | INTRAMUSCULAR | Status: DC
  Administered 2015-09-27 – 2015-09-28 (×3): 3 mL via INTRAVENOUS

## 2015-09-27 MED ORDER — CLONAZEPAM 1 MG PO TABS: 1.0000 mg | ORAL_TABLET | Freq: Every day | ORAL | Status: DC | PRN

## 2015-09-27 MED ORDER — KETOROLAC TROMETHAMINE 15 MG/ML IJ SOLN
15.0000 mg | Freq: Once | INTRAMUSCULAR | Status: AC
Start: 2015-09-27 — End: 2015-09-27
  Administered 2015-09-27: 15 mg via INTRAVENOUS
  Filled 2015-09-27: qty 1

## 2015-09-27 MED ORDER — TICAGRELOR 90 MG PO TABS
90.0000 mg | ORAL_TABLET | Freq: Two times a day (BID) | ORAL | Status: DC
  Administered 2015-09-27 – 2015-09-28 (×4): 90 mg via ORAL
  Filled 2015-09-27 (×4): qty 1

## 2015-09-27 MED ORDER — LOSARTAN POTASSIUM 50 MG PO TABS
50.0000 mg | ORAL_TABLET | Freq: Every day | ORAL | Status: DC
  Administered 2015-09-27 – 2015-09-28 (×2): 50 mg via ORAL
  Filled 2015-09-27 (×2): qty 1

## 2015-09-27 MED ORDER — NICOTINE 21 MG/24HR TD PT24
21.0000 mg | MEDICATED_PATCH | Freq: Every day | TRANSDERMAL | Status: DC
  Administered 2015-09-27 – 2015-09-28 (×2): 21 mg via TRANSDERMAL
  Filled 2015-09-27 (×2): qty 1

## 2015-09-27 MED ORDER — COLCHICINE 0.6 MG PO TABS
0.6000 mg | ORAL_TABLET | Freq: Two times a day (BID) | ORAL | Status: DC
  Administered 2015-09-27 (×3): 0.6 mg via ORAL
  Filled 2015-09-27 (×4): qty 1

## 2015-09-27 MED ORDER — ENOXAPARIN SODIUM 40 MG/0.4ML ~~LOC~~ SOLN
40.0000 mg | Freq: Every day | SUBCUTANEOUS | Status: DC
  Administered 2015-09-27: 40 mg via SUBCUTANEOUS
  Filled 2015-09-27 (×2): qty 0.4

## 2015-09-27 MED ORDER — LOSARTAN POTASSIUM-HCTZ 50-12.5 MG PO TABS: 1.0000 | ORAL_TABLET | Freq: Every day | ORAL | Status: DC

## 2015-09-27 MED ORDER — METOPROLOL TARTRATE 25 MG PO TABS
25.0000 mg | ORAL_TABLET | Freq: Two times a day (BID) | ORAL | Status: DC
  Administered 2015-09-27 – 2015-09-28 (×3): 25 mg via ORAL
  Filled 2015-09-27 (×4): qty 1

## 2015-09-27 MED ORDER — ATORVASTATIN CALCIUM 80 MG PO TABS
80.0000 mg | ORAL_TABLET | Freq: Every day | ORAL | Status: DC
  Administered 2015-09-27 (×2): 80 mg via ORAL
  Filled 2015-09-27 (×2): qty 1

## 2015-09-27 MED ORDER — ASPIRIN 325 MG PO TABS
650.0000 mg | ORAL_TABLET | Freq: Three times a day (TID) | ORAL | Status: DC
  Administered 2015-09-27 (×3): 650 mg via ORAL
  Filled 2015-09-27 (×4): qty 2

## 2015-09-27 MED ORDER — HYDROCHLOROTHIAZIDE 12.5 MG PO CAPS
12.5000 mg | ORAL_CAPSULE | Freq: Every day | ORAL | Status: DC
  Administered 2015-09-27 – 2015-09-28 (×2): 12.5 mg via ORAL
  Filled 2015-09-27 (×2): qty 1

## 2015-09-27 MED ORDER — ONDANSETRON HCL 4 MG/2ML IJ SOLN
4.0000 mg | Freq: Four times a day (QID) | INTRAMUSCULAR | Status: DC | PRN
  Administered 2015-09-27: 4 mg via INTRAVENOUS
  Filled 2015-09-27: qty 2

## 2015-09-27 NOTE — Progress Notes (Addendum)
Subjective:  Patient admitted last night with chest pain is partially rhythm leave her with nitroglycerin.  She had an inferior MI and stenting just a few weeks ago.  She has had constant left chest pain that is tender to touch and a question was raised last light of Dressler result of the pain was sudden onset last night and felt like her previous pain of heart attack.  She is quite hostile right now wanting to know why this happened again.  She also complains of feeling severely fatigued  in the mornings and has shortness of breath when she is standing.  She also is not happy about taking a statin and expresses her extreme displeasure about having to be on this.  Says that she is not smoking.    Objective:  Vital Signs in the last 24 hours: BP 138/55 mmHg  Pulse 56  Temp(Src) 98.5 F (36.9 C) (Oral)  Resp 18  Ht  (1.651 m)  Wt 87.771 kg (193 lb 8 oz)  BMI 32.20 kg/m2  SpO2 96%  Physical Exam: Somewhat hostile talkative white female in no acute distress Lungs:  Clear Cardiac:  Regular rhythm, normal S1 and S2, no S3, no pericardial rub or murmur noted Abdomen:  Soft, nontender, no masses Extremities:  No edema present  Intake/Output from previous day:    Weight Filed Weights   09/26/15 2245 09/27/15 0040  Weight: 86.637 kg (191 lb) 87.771 kg (193 lb 8 oz)    Lab Results: Basic Metabolic Panel:  Recent Labs  11/91/47 2109 09/27/15 0325  NA 142 143  K 4.3 3.5  CL 105 107  CO2 25 27  GLUCOSE 108* 145*  BUN 17 17  CREATININE 0.93 1.02*   CBC:  Recent Labs  09/26/15 2109 09/27/15 0325  WBC 8.9 8.6  NEUTROABS 5.1  --   HGB 12.3 12.1  HCT 36.7 36.1  MCV 90.8 90.7  PLT 256 259   Cardiac Enzymes: Troponin (Point of Care Test)  Recent Labs  09/26/15 2111  TROPIPOC 0.01   Telemetry: Sinus rhythm  Assessment/Plan:  1.  Chest discomfort similar to previous pain of MI but with initial negative troponin and EKG 2.  Atypical left chest pain associated  with chest wall tenderness sounds noncardiac 3.  CAD with previous stenting of the right coronary artery 4.  Dyspnea of uncertain cause  Recommendations:  Await serial cardiac enzymes.  Await echocardiogram.  I doubt that this is Dressler's syndrome in light of the low CRP level.  Go ahead and let her eat and ambulate later on.      Darden Palmer  MD [facility]  09/27/2015, 11:44 AM

## 2015-09-27 NOTE — ED Notes (Signed)
Delay in patient being transported to the floor due to Dr. Rise Paganini being at bedside

## 2015-09-28 ENCOUNTER — Observation Stay (HOSPITAL_COMMUNITY): Payer: 59

## 2015-09-28 DIAGNOSIS — R0600 Dyspnea, unspecified: Secondary | ICD-10-CM

## 2015-09-28 DIAGNOSIS — R0789 Other chest pain: Secondary | ICD-10-CM | POA: Diagnosis not present

## 2015-09-28 DIAGNOSIS — I2510 Atherosclerotic heart disease of native coronary artery without angina pectoris: Secondary | ICD-10-CM

## 2015-09-28 DIAGNOSIS — E669 Obesity, unspecified: Secondary | ICD-10-CM

## 2015-09-28 LAB — TROPONIN I: Troponin I: <0.03 ng/mL (ref <0.031)

## 2015-09-28 LAB — GLUCOSE, CAPILLARY: GLUCOSE-CAPILLARY: 122 mg/dL — AB (ref 65–99)

## 2015-09-28 MED ORDER — AMLODIPINE BESYLATE 5 MG PO TABS: 2.5000 mg | ORAL_TABLET | Freq: Every day | ORAL | Status: DC

## 2015-09-28 MED ORDER — HYDROCHLOROTHIAZIDE 12.5 MG PO CAPS: 12.5000 mg | ORAL_CAPSULE | Freq: Every day | ORAL | Status: DC

## 2015-09-28 MED ORDER — LOSARTAN POTASSIUM-HCTZ 100-12.5 MG PO TABS: 1.0000 | ORAL_TABLET | Freq: Every day | ORAL | Status: DC

## 2015-09-28 MED ORDER — LOSARTAN POTASSIUM 50 MG PO TABS: 100.0000 mg | ORAL_TABLET | Freq: Every day | ORAL | Status: DC

## 2015-09-28 MED ORDER — AMLODIPINE BESYLATE 2.5 MG PO TABS: 2.5000 mg | ORAL_TABLET | Freq: Every day | ORAL | Status: DC

## 2015-09-28 MED ORDER — ASPIRIN EC 81 MG PO TBEC: 81.0000 mg | DELAYED_RELEASE_TABLET | Freq: Every day | ORAL | Status: DC

## 2015-09-28 NOTE — Progress Notes (Signed)
09/28/2015 2:08 PM  Discharge AVS meds taken today and those due this evening reviewed.  Follow-up appointments and when to call md reviewed.  D/C IV and TELE.  Questions and concerns addressed.   D/C home per orders. Kathryne Hitch

## 2015-09-28 NOTE — Progress Notes (Signed)
09/28/2015 8:43 AM Pt not willing to wear her Heart Monitor, wants to take and shower.  States she is going home.  CCMD notified.  Will call echo tech to see if they can come ASAP before this pt walks out. Kathryne Hitch

## 2015-09-28 NOTE — Progress Notes (Signed)
  Echocardiogram 2D Echocardiogram has been performed.  Leta Jungling M 09/28/2015, 9:45 AM

## 2015-09-28 NOTE — Progress Notes (Addendum)
Subjective:  Patient took her monitor off and wants to go home.  She has no chest discomfort at the present time and EKG and enzymes are unchanged.  Her echocardiogram did not show a pericardial effusion shows good LV function.  She is less hostile than she was yesterday.  The pressure is mildly elevated and it is fluctuating.  Objective:  Vital Signs in the last 24 hours: BP 133/65 mmHg  Pulse 55  Temp(Src) 97.3 F (36.3 C) (Oral)  Resp 18  Ht  (1.651 m)  Wt 87.771 kg (193 lb 8 oz)  BMI 32.20 kg/m2  SpO2 94%  Physical Exam: Middle-aged white female in no acute distress Lungs:  Clear Cardiac:  Regular rhythm, normal S1 and S2, no S3, no pericardial rub or murmur noted Extremities:  No edema present  Intake/Output from previous day: 01/21 0701 - 01/22 0700 In: 483 [P.O.:480; I.V.:3] Out: -   Weight Filed Weights   09/26/15 2245 09/27/15 0040  Weight: 86.637 kg (191 lb) 87.771 kg (193 lb 8 oz)    Lab Results: Basic Metabolic Panel:  Recent Labs  16/10/96 2109 09/27/15 0325  NA 142 143  K 4.3 3.5  CL 105 107  CO2 25 27  GLUCOSE 108* 145*  BUN 17 17  CREATININE 0.93 1.02*   CBC:  Recent Labs  09/26/15 2109 09/27/15 0325  WBC 8.9 8.6  NEUTROABS 5.1  --   HGB 12.3 12.1  HCT 36.7 36.1  MCV 90.8 90.7  PLT 256 259   Cardiac Enzymes: Cardiac Panel (last 3 results)  Recent Labs  09/27/15 1840 09/27/15 2345 09/28/15 0611  TROPONINI <0.03 <0.03 <0.03   Telemetry: Sinus rhythm  Assessment/Plan:  1.  Chest discomfort similar to previous pain of MI but with negative troponin and EKG.  I don't think this is Dressler's because of the lack of an effusion as well as a low CRP. 2.  Atypical left chest pain associated with chest wall tenderness sounds noncardiac 3.  CAD with previous stenting of the right coronary artery 4.  Dyspnea of uncertain cause 5.  Hypertension persistent since admission  Recommendations:  Echocardiogram today shows preserved  LV function with no inferior hypokinesis.  I would increase her losartan to 100 mg daily on discharge and add amlodipine 2.5 mg and continue to check her blood pressure.  I would recommend that she be seen in the office in the next week since she is not due to see Dr. Tresa Endo until February.  I would not discharge her on colchicine.      Darden Palmer  MD [facility]  09/28/2015, 12:06 PM

## 2015-09-28 NOTE — Discharge Summary (Signed)
Discharge Summary    Patient ID: Leslie Dorsey,  MRN: [id number], DOB/AGE: [birth year] 65 y.o.  Admit date: 09/26/2015 Discharge date: 09/28/2015  Primary Care Provider: [facility] Has Primary Cardiologist: Dr. Nicki Guadalajara  Discharge Diagnoses    Principal Problem:   Atypical chest pain Active Problems:   HTN (hypertension)   Former tobacco use   Anxiety   Chronic coronary artery disease   CAD in native artery   Dyspnea   Obesity   Allergies Allergies  Allergen Reactions  . Morphine And Related Anaphylaxis  . Levofloxacin Rash    Diagnostic Studies/Procedures    2D echo 09/28/15 - Left ventricle: The cavity size was normal. There was mildconcentric hypertrophy. Systolic function was normal. The estimated ejection fraction was in the range of 55% to 60%. Wallmotion was normal; there were no regional wall motion abnormalities. _____________   History of Present Illness and Hospital Course    Leslie Dorsey is a 65 y/o F with history of CAD (inferior STEMI 08/2015 s/p DES to RCA), HTN, former tobacco abuse, obesity, HLD who presented to [facility] 09/26/2015 with chest pain. She began having chest discomfort the evening of admission after taking a bath which was pressure like sensation in the middle of the chest. The chest pain improved with NTG. She felt that the pressure was similar to her prior MI. It was 10/10 in intensity. She was having difficulty breathing. She has been compliant with DAPT at home. She has had SOB while standing up but laying down has felt fine. She quit smoking in December. She was also reporting a second pain in her shoulder and right rib cage as well as tenderness in the left arm. EKG showed sinus bradycardia, 59 bpm, normal axis, no ST/T wave changes, normal R wave progression. Symptoms were felt atypical, possibly post-MI pericarditis, and she was initially started on colchicine and aspirin. CXR was nonacute. Troponins remained negative  x 4. CRP was negative. TSH was normal. F/u echo 09/28/15: mild LVH, EF 55-60%, no RWMA. The patient became somewhat hostile during her interactions wanting to know why this happened again. She was complaining of feeling severely fatigued in the mornings. She was also not happy about taking a statin and expressed her extreme displeasure about having to be on this. Dr. Donnie Aho did not feel that her symptoms represented Dressler's syndrome. He felt symptoms were atypical, possibly musculoskeletal in etiology. He recommended to increase losartan to  daily, add amlodipine 2.5mg  daily, and continue to check BP as it was running higher since admission. He recommended she be seen in the office in the next week since she is not due to see Dr. Tresa Endo until February.I have sent a message to our office's scheduler requesting a follow-up appointment, and our office will call the patient with this information. _____________  Discharge Vitals Blood pressure 133/65, pulse 55, temperature 97.3 F (36.3 C), temperature source Oral, resp. rate 18, height  (1.651 m), weight 193 lb 8 oz (87.771 kg), SpO2 94 %.  Filed Weights   09/26/15 2245 09/27/15 0040  Weight: 191 lb (86.637 kg) 193 lb 8 oz (87.771 kg)    Labs & Radiologic Studies     CBC  Recent Labs  09/26/15 2109 09/27/15 0325  WBC 8.9 8.6  NEUTROABS 5.1  --   HGB 12.3 12.1  HCT 36.7 36.1  MCV 90.8 90.7  PLT 256 259   Basic Metabolic Panel  Recent Labs  09/26/15 2109 09/27/15 0325  NA  142 143  K 4.3 3.5  CL 105 107  CO2 25 27  GLUCOSE 108* 145*  BUN 17 17  CREATININE 0.93 1.02*  CALCIUM 9.2 9.0   Liver Function Tests  Recent Labs  09/27/15 0325  AST 25  ALT 27  ALKPHOS 88  BILITOT 0.5  PROT 6.3*  ALBUMIN 3.4*   Cardiac Enzymes  Recent Labs  09/27/15 1840 09/27/15 2345 09/28/15 0611  TROPONINI <0.03 <0.03 <0.03   Thyroid Function Tests  Recent Labs  09/27/15 0325  TSH 3.079    Dg Chest 2  View  09/26/2015  CLINICAL DATA:  Central chest pain radiating outward, severe EXAM: CHEST  2 VIEW COMPARISON:  09/10/2015 FINDINGS: The heart size and vascular pattern are normal. No consolidation or effusion. No pneumothorax. IMPRESSION: No active cardiopulmonary disease. Electronically Signed   By: Esperanza Heir M.D.   On: 09/26/2015 21:33   Dg Chest 2 View  09/10/2015  CLINICAL DATA:  Shortness breath. EXAM: CHEST  2 VIEW COMPARISON:  08/26/2015. FINDINGS: Mediastinum and hilar structures are normal. Lungs are clear. Heart size normal. No pleural effusion or pneumothorax. IMPRESSION: No acute cardiopulmonary disease. Electronically Signed   By: Maisie Fus  Register   On: 09/10/2015 12:09    Disposition   Pt is being discharged home today in good condition.  Follow-up Plans & Appointments    Follow-up Information    Follow up with Lennette Bihari, MD.   Specialty:  Cardiology   Why:  Office will call you for your followup appointment. Call office if you have not heard back in 3 days.   Contact information:   [address] [place] [address] [phone]      Discharge Instructions    Diet - low sodium heart healthy    Complete by:  As directed      Increase activity slowly    Complete by:  As directed   Your Hyzaar dose (losartan/HCTZ) has changed - new tablet. Amlodipine was added for your blood pressure. Please monitor your blood pressure occasionally at home. Call your doctor if you tend to get readings of greater than 130 on the top number or 80 on the bottom number.           Discharge Medications   Current Discharge Medication List    START taking these medications   Details  amLODipine (NORVASC) 2.5 MG tablet Take 1 tablet (2.5 mg total) by mouth daily. Qty: 30 tablet, Refills: 3    losartan-hydrochlorothiazide (HYZAAR) 100-12.5 MG tablet Take 1 tablet by mouth daily. Qty: 30 tablet, Refills: 3      CONTINUE these medications which have NOT  CHANGED   Details  albuterol (PROVENTIL HFA;VENTOLIN HFA) 108 (90 Base) MCG/ACT inhaler Inhale 1-2 puffs into the lungs every 6 (six) hours as needed for wheezing or shortness of breath.    aspirin EC 81 MG EC tablet Take 1 tablet (81 mg total) by mouth daily.    atorvastatin (LIPITOR) 80 MG tablet Take 1 tablet (80 mg total) by mouth daily at 6 PM.     clonazePAM (KLONOPIN) 1 MG tablet Take 1 mg by mouth daily as needed for anxiety.  Refills: 1    Cyanocobalamin (VITAMIN B-12 PO) Take 1 tablet by mouth daily at 12 noon.    cyclobenzaprine (FLEXERIL) 10 MG tablet Take 5 mg by mouth daily as needed for muscle spasms.      metoprolol tartrate (LOPRESSOR) 25 MG tablet Take 1 tablet (25 mg  total) by mouth 2 (two) times daily.     nicotine (NICODERM CQ - DOSED IN MG/24 HOURS) 14 mg/24hr patch Place 1 patch (14 mg total) onto the skin daily.     nitroGLYCERIN (NITROSTAT) 0.4 MG SL tablet Place 1 tablet (0.4 mg total) under the tongue every 5 (five) minutes x 3 doses as needed for chest pain.     ticagrelor (BRILINTA) 90 MG TABS tablet Take 1 tablet (90 mg total) by mouth 2 (two) times daily.       STOP taking these medications     losartan-hydrochlorothiazide (HYZAAR) 50-12.5 MG tablet           Outstanding Labs/Studies   N/A  Duration of Discharge Encounter   Greater than 30 minutes including physician time.  Signed, Laurann Montana PA-C 09/28/2015, 12:52 PM

## 2015-09-30 ENCOUNTER — Telehealth (HOSPITAL_COMMUNITY): Payer: Self-pay | Admitting: Cardiac Rehabilitation

## 2015-09-30 NOTE — Telephone Encounter (Signed)
pc to pt to discuss enrolling in cardiac rehab.  Pt declined stating she is exercising on her own at home.

## 2015-10-06 NOTE — Progress Notes (Signed)
Patient is a no show  This encounter was created in error - please disregard.

## 2015-10-07 ENCOUNTER — Encounter: Payer: 59 | Admitting: Physician Assistant

## 2015-11-04 ENCOUNTER — Other Ambulatory Visit: Payer: 59

## 2015-11-10 ENCOUNTER — Ambulatory Visit (INDEPENDENT_AMBULATORY_CARE_PROVIDER_SITE_OTHER): Payer: 59 | Admitting: Physician Assistant

## 2015-11-10 ENCOUNTER — Encounter: Payer: Self-pay | Admitting: Physician Assistant

## 2015-11-10 VITALS — BP 120/70 | HR 56 | Ht 65.0 in | Wt 201.4 lb

## 2015-11-10 DIAGNOSIS — I10 Essential (primary) hypertension: Secondary | ICD-10-CM | POA: Diagnosis not present

## 2015-11-10 DIAGNOSIS — R0600 Dyspnea, unspecified: Secondary | ICD-10-CM

## 2015-11-10 DIAGNOSIS — I2510 Atherosclerotic heart disease of native coronary artery without angina pectoris: Secondary | ICD-10-CM

## 2015-11-10 DIAGNOSIS — Z87891 Personal history of nicotine dependence: Secondary | ICD-10-CM

## 2015-11-10 DIAGNOSIS — R0602 Shortness of breath: Secondary | ICD-10-CM | POA: Diagnosis not present

## 2015-11-10 MED ORDER — CLOPIDOGREL BISULFATE 75 MG PO TABS: 75.0000 mg | ORAL_TABLET | Freq: Every day | ORAL | Status: DC

## 2015-11-10 NOTE — Assessment & Plan Note (Signed)
Patient continues to have dyspnea since her MI with recent hospitalization. 2-D echo showed normal LV function without wall motion abnormality. I suspect her symptoms may be coming from Brilinta. I discussed this with Dr. Katrinka BlazingSmith who recommends trying Plavix 75 mg daily. She is to take her first dose with a Brilinta and then stop the Brilinta. Follow-up with Dr. Tresa EndoKelly in 2 weeks. Patient has quit smoking.

## 2015-11-10 NOTE — Progress Notes (Signed)
Cardiology Office Note   Date:  11/10/2015   ID:  Leslie GroutBetty D Inclan, DOB March 07, 1951, MRN [id number]  PCP:  [facility] HasMORROW, AARON, MD  Cardiologist: Dr. Tresa EndoKelly   Chief Complaint: shortness of breath    History of Present Illness: Leslie Dorsey is a 65 y.o. female who presents for post hospital follow-up. She has a history of CAD status post inferior STEMI 08/2015 status post DES to RCA, hypertension, former tobacco abuse, obesity, HLD. [facility] with chest pain. Was felt she might have post MI pericarditis and was started on cultures seen and aspirin. Troponins were negative, CRP negative, 2-D echo 09/28/15 mild LVH, EF 55-60% with no regional wall motion abnormality. Pain was felt to be musculoskeletal. Losartan was increased and amlodipine added.  Patient comes in today complaining of the same symptoms. She feels like she is under water and always having to take a deep breath. She also complains of dyspnea on exertion with little activity. She has quite smoking and is very frustrated with her symptoms. She is also having an endoscopy soon for some GI symptoms and excessive gas.  Past Medical History  Diagnosis Date  . Essential hypertension   . ST elevation (STEMI) myocardial infarction involving right coronary artery (HCC)   . CAD (coronary artery disease)     a. Inf STEMI 08/2015 - inferior STEMI 08/2015 s/p DES to RCA.  . Tobacco abuse   . Obesity   . Hyperlipidemia   . Hyperglycemia     a. 08/2015: A1C 5.8.    Past Surgical History  Procedure Laterality Date  . Abdominal hysterectomy    . Hemorroidectomy    . Cardiac catheterization N/A 08/26/2015    Procedure: Left Heart Cath and Coronary Angiography;  Surgeon: Lennette Biharihomas A Kelly, MD;  [facility];  Service: Cardiovascular;  Laterality: N/A;  . Cardiac catheterization N/A 08/26/2015    Procedure: Coronary Stent Intervention;  Surgeon: Lennette Biharihomas A Kelly, MD;  Location: [facility] INVASIVE CV LAB;  Service:  Cardiovascular;  Laterality: N/A;     Current Outpatient Prescriptions  Medication Sig Dispense Refill  . albuterol (PROVENTIL HFA;VENTOLIN HFA) 108 (90 Base) MCG/ACT inhaler Inhale 1-2 puffs into the lungs every 6 (six) hours as needed for wheezing or shortness of breath.    Marland Kitchen. amLODipine (NORVASC) 2.5 MG tablet Take 1 tablet (2.5 mg total) by mouth daily. 30 tablet 3  . aspirin EC 81 MG EC tablet Take 1 tablet (81 mg total) by mouth daily.    Marland Kitchen. atorvastatin (LIPITOR) 80 MG tablet Take 1 tablet (80 mg total) by mouth daily at 6 PM. 90 tablet 3  . clonazePAM (KLONOPIN) 1 MG tablet Take 1 mg by mouth daily as needed for anxiety.   1  . Cyanocobalamin (VITAMIN B-12 PO) Take 1 tablet by mouth daily at 12 noon.    . cyclobenzaprine (FLEXERIL) 10 MG tablet Take 5 mg by mouth daily as needed for muscle spasms.   0  . losartan-hydrochlorothiazide (HYZAAR) 100-12.5 MG tablet Take 1 tablet by mouth daily. 30 tablet 3  . metoprolol tartrate (LOPRESSOR) 25 MG tablet Take 1 tablet (25 mg total) by mouth 2 (two) times daily. 180 tablet 3  . nicotine (NICODERM CQ - DOSED IN MG/24 HOURS) 14 mg/24hr patch Place 1 patch (14 mg total) onto the skin daily. 28 patch 2  . nitroGLYCERIN (NITROSTAT) 0.4 MG SL tablet Place 1 tablet (0.4 mg total) under the tongue every 5 (five) minutes x 3  doses as needed for chest pain. 25 tablet 3  . clopidogrel (PLAVIX) 75 MG tablet Take 1 tablet (75 mg total) by mouth daily. 90 tablet 3   No current facility-administered medications for this visit.    Allergies:   Morphine and related and Levofloxacin    Social History:  The patient  reports that she quit smoking about 2 months ago. She does not have any smokeless tobacco history on file. She reports that she does not drink alcohol or use illicit drugs.   Family History:  The patient's    family history includes Heart attack in her mother; Heart disease in her mother.    ROS:  Please see the history of present illness.    Otherwise, review of systems are positive for none.   All other systems are reviewed and negative.    PHYSICAL EXAM: VS:  BP 120/70 mmHg  Pulse 56  Ht  (1.651 m)  Wt 201 lb 6.4 oz (91.354 kg)  BMI 33.51 kg/m2 , BMI Body mass index is 33.51 kg/(m^2). GEN: Well nourished, well developed, in no acute distress Neck: no JVD, HJR, carotid bruits, or masses Cardiac: RRR;2/6 systolic murmur RSB, no gallop, rubs, thrill or heave,  Respiratory:  clear to auscultation bilaterally, normal work of breathing GI: soft, nontender, nondistended, + BS MS: no deformity or atrophy Extremities: without cyanosis, clubbing, edema, good distal pulses bilaterally.  Skin: warm and dry, no rash Neuro:  Strength and sensation are intact    EKG:  EKG is ordered today. The ekg ordered today demonstrates last bradycardia 58 bpm, no acute change   Recent Labs: 08/26/2015: B Natriuretic Peptide 51.1; Magnesium 2.0 09/27/2015: ALT 27; BUN 17; Creatinine, Ser 1.02*; Hemoglobin 12.1; Platelets 259; Potassium 3.5; Sodium 143; TSH 3.079    Lipid Panel    Component Value Date/Time   CHOL 232* 08/27/2015 1220   TRIG 191* 08/27/2015 1220   HDL 48 08/27/2015 1220   CHOLHDL 4.8 08/27/2015 1220   VLDL 38 08/27/2015 1220   LDLCALC 146* 08/27/2015 1220      Wt Readings from Last 3 Encounters:  11/10/15 201 lb 6.4 oz (91.354 kg)  09/27/15 193 lb 8 oz (87.771 kg)  09/05/15 191 lb 12.8 oz (87 kg)      Other studies Reviewed: Additional studies/ records that were reviewed today include and review of the records demonstrates: 2-D echo 09/28/15 Study Conclusions  - Left ventricle: The cavity size was normal. There was mild   concentric hypertrophy. Systolic function was normal. The   estimated ejection fraction was in the range of 55% to 60%. Wall   motion was normal; there were no regional wall motion   abnormalities.  Cardiac catheterization 08/26/15 Conclusion      Ost Cx lesion, 10%  stenosed.  Mid LAD to Dist LAD lesion, 20% stenosed.  Prox RCA lesion, 99% stenosed. Post intervention, there is a 0% residual stenosis.  The left ventricular systolic function is normal.   Acute inferior ST segment elevation myocardial infarction secondary to subtotal 99% RCA stenosis with thrombus in the proximal to midsegment and a large dominant RCA.  Mild concomitant nonobstructive CAD with smooth 20% narrowing in the LAD, and 10% ostial narrowing in the left circumflex coronary artery.  Preserved LV function with an acute ejection fraction of 55%.  Successful PCI to a dominant RCA with a 99% stenosis being reduced to 0% with ultimate insertion of a 3.515 mm Resolute DES stent postdilated to 3.70 with 99%  stenosis reduced to 0% and brisk TIMI-3 flow.  RECOMMENDATION: Patient will be maintained on dual antiplatelet therapy for minimum of a year.  She will be started on very low dose beta blocker therapy and Ace inhibition as blood pressure allows.  Aggressive statin intervention will be necessary.  Smoking cessation is essential.           ASSESSMENT AND PLAN: Dyspnea Patient continues to have dyspnea since her MI with recent hospitalization. 2-D echo showed normal LV function without wall motion abnormality. I suspect her symptoms may be coming from Brilinta. I discussed this with Dr. Katrinka Blazing who recommends trying Plavix 75 mg daily. She is to take her first dose with a Brilinta and then stop the Brilinta. Follow-up with Dr. Tresa Endo in 2 weeks. Patient has quit smoking.  CAD in native artery Stable without recurrent chest pain.  HTN (hypertension) Blood pressure is stable  Former tobacco use Patient quit smoking     Signed, Jacolyn Reedy, PA-C  11/10/2015 8:49 AM    [facility] [address] [place], [place], [place]  [address] Phone: [phone]; Fax: [phone]

## 2015-11-10 NOTE — Assessment & Plan Note (Signed)
Patient quit smoking 

## 2015-11-10 NOTE — Patient Instructions (Signed)
Medication Instructions:   START TAKING PLAVIX 75 MG ONCE A DAY   TAKE FIRST DOSE OF PLAVIX WITH BIRLINTA  STOP BIRLINTA  If you need a refill on your cardiac medications before your next appointment, please call your pharmacy.  Labwork: NONE ORDER TODAY    Testing/Procedures:  NONE ORDER TODAY    Follow-Up:  SEE DR Tresa EndoKELLY AS SCHEDULED   Any Other Special Instructions Will Be Listed Below (If Applicable).

## 2015-11-10 NOTE — Assessment & Plan Note (Signed)
Stable without recurrent chest pain 

## 2015-11-10 NOTE — Assessment & Plan Note (Signed)
Blood pressure is stable 

## 2015-11-25 ENCOUNTER — Encounter: Payer: Self-pay | Admitting: Cardiovascular Disease

## 2015-11-25 ENCOUNTER — Ambulatory Visit (INDEPENDENT_AMBULATORY_CARE_PROVIDER_SITE_OTHER): Payer: 59 | Admitting: Cardiovascular Disease

## 2015-11-25 VITALS — BP 132/86 | HR 64 | Ht 65.0 in | Wt 201.6 lb

## 2015-11-25 DIAGNOSIS — I2111 ST elevation (STEMI) myocardial infarction involving right coronary artery: Secondary | ICD-10-CM | POA: Diagnosis not present

## 2015-11-25 DIAGNOSIS — I10 Essential (primary) hypertension: Secondary | ICD-10-CM

## 2015-11-25 DIAGNOSIS — Z87891 Personal history of nicotine dependence: Secondary | ICD-10-CM | POA: Diagnosis not present

## 2015-11-25 DIAGNOSIS — E669 Obesity, unspecified: Secondary | ICD-10-CM | POA: Diagnosis not present

## 2015-11-25 DIAGNOSIS — I2510 Atherosclerotic heart disease of native coronary artery without angina pectoris: Secondary | ICD-10-CM

## 2015-11-25 MED ORDER — AMLODIPINE BESYLATE 5 MG PO TABS: 5.0000 mg | ORAL_TABLET | Freq: Every day | ORAL | Status: DC

## 2015-11-25 NOTE — Patient Instructions (Signed)
Your physician has recommended you make the following change in your medication:   1.) the amlodipine has been increased to 5 mg daily. A new prescription has been sent to your pharmacy to reflect this change.  Your physician recommends that you schedule a follow-up appointment in: 3-4 months

## 2015-11-30 ENCOUNTER — Encounter: Payer: Self-pay | Admitting: Cardiovascular Disease

## 2015-11-30 NOTE — Progress Notes (Signed)
Patient ID: Leslie Dorsey, female   DOB: 1950/12/17, 65 y.o.   MRN: [id number]    Primary MD: Dr. London Pepper  HPI: Leslie Dorsey is a 65 y.o. female who presents to the office today for a cardiology evaluation  Following her ST segment elevation myocardial infarction in December 2016.    [name] has a long-standing history of tobacco abuse tarted smoking at age 27 when living in [place].  She has a history of hypertension, hyperlipidemia, as well as obesity.  In December she began to spirits episodes of chest and back discomfort.  On December 2016 she developed severe chest pain and was found to have 3 mm of inferior ST segment elevation consistent with an inferior STEMI.  I performed emergent cardiac catheterization which revealed a 99% stenosis in a large dominant RCA.  She had minimal narrowing of 10 and 20% in her LAD.  She underwent successful PCI to her RCA and a 3.5 15 mm Resolute DES stent was inserted, postdilated 3.7 mm with reduction in the 99% stenosis to 0% and return of brisk TIMI-3 flow.   She presented to [facility] on 09/26/2015 with some vague chest pain and ruled out.  She was felt possibly to have pericarditis.  Troponins were negative.  A 2-D echo Doppler study showed mild LVH with normal systolic function without regional wall motion abnormality.  He was felt that her pain was musculoskeletal.  She was hypertensive and losartan was increased and amlodipine was added.  She was seen by Margarite Gouge on 11/10/2015  Follow-up.  She was having some vague dyspnea.  At that time her Brilinta was discontinued and she was changed to Plavix in the event that this may have been Brilinta mediated. She presents now for follow-up evaluation.  She denies recurrent chest pain.  Her breathing is better. She denies palpitations. She quit tobacco in December following her MI.  She tells me she had undergone laboratory at [facility] in late February. She will also be seeing and [facility] gastro[facility]ologist to  have a future endoscopy.  Past Medical History  Diagnosis Date  . Essential hypertension   . ST elevation (STEMI) myocardial infarction involving right coronary artery ([place])   . CAD (coronary artery disease)     a. Inf STEMI 08/2015 - inferior STEMI 08/2015 s/p DES to RCA.  . Tobacco abuse   . Obesity   . Hyperlipidemia   . Hyperglycemia     a. 08/2015: A1C 5.8.    Past Surgical History  Procedure Laterality Date  . Abdominal hysterectomy    . Hemorroidectomy    . Cardiac catheterization N/A 08/26/2015    Procedure: Left Heart Cath and Coronary Angiography;  Surgeon: Troy Sine, MD;  Location: [facility];  Service: Cardiovascular;  Laterality: N/A;  . Cardiac catheterization N/A 08/26/2015    Procedure: Coronary Stent Intervention;  Surgeon: Troy Sine, MD;  Location: [facility];  Service: Cardiovascular;  Laterality: N/A;    Allergies  Allergen Reactions  . Levofloxacin Anaphylaxis, Hives and Rash  . Morphine And Related Anaphylaxis    Current Outpatient Prescriptions  Medication Sig Dispense Refill  . albuterol (PROVENTIL HFA;VENTOLIN HFA) 108 (90 Base) MCG/ACT inhaler Inhale 1-2 puffs into the lungs every 6 (six) hours as needed for wheezing or shortness of breath.    Marland Kitchen aspirin EC 81 MG EC tablet Take 1 tablet (81 mg total) by mouth daily.    Marland Kitchen atorvastatin (LIPITOR) 80 MG tablet Take  1 tablet (80 mg total) by mouth daily at 6 PM. 90 tablet 3  . clonazePAM (KLONOPIN) 1 MG tablet Take 1 mg by mouth daily as needed for anxiety.   1  . clopidogrel (PLAVIX) 75 MG tablet Take 1 tablet (75 mg total) by mouth daily. 90 tablet 3  . Cyanocobalamin (VITAMIN B-12 PO) Take 1 tablet by mouth daily at 12 noon.    . cyclobenzaprine (FLEXERIL) 10 MG tablet Take 5 mg by mouth daily as needed for muscle spasms.   0  . loratadine (CLARITIN) 10 MG tablet Take 10 mg by mouth daily as needed for allergies.    Marland Kitchen losartan-hydrochlorothiazide (HYZAAR) 100-12.5 MG tablet  Take 1 tablet by mouth daily. 30 tablet 3  . metoprolol tartrate (LOPRESSOR) 25 MG tablet Take 1 tablet (25 mg total) by mouth 2 (two) times daily. 180 tablet 3  . nicotine (NICODERM CQ - DOSED IN MG/24 HOURS) 14 mg/24hr patch Place 1 patch (14 mg total) onto the skin daily. 28 patch 2  . nitroGLYCERIN (NITROSTAT) 0.4 MG SL tablet Place 1 tablet (0.4 mg total) under the tongue every 5 (five) minutes x 3 doses as needed for chest pain. 25 tablet 3  . Pyridoxine HCl (B-6 PO) Take 1 tablet by mouth daily.    Marland Kitchen amLODipine (NORVASC) 5 MG tablet Take 1 tablet (5 mg total) by mouth daily. 30 tablet 6   No current facility-administered medications for this visit.    Social History   Social History  . Marital Status: Married    Spouse Name: N/A  . Number of Children: N/A  . Years of Education: N/A   Occupational History  . Not on file.   Social History Main Topics  . Smoking status: Former Smoker    Quit date: 08/26/2015  . Smokeless tobacco: Not on file  . Alcohol Use: No  . Drug Use: No  . Sexual Activity: Not on file   Other Topics Concern  . Not on file   Social History Narrative    Family History  Problem Relation Age of Onset  . Heart disease Mother   . Heart attack Mother     ROS General: Negative; No fevers, chills, or night sweats HEENT: Negative; No changes in vision or hearing, sinus congestion, difficulty swallowing Pulmonary: Negative; No cough, wheezing, shortness of breath, hemoptysis Cardiovascular: See HPI:  GI: Negative; No nausea, vomiting, diarrhea, or abdominal pain GU: Negative; No dysuria, hematuria, or difficulty voiding Musculoskeletal: Negative; no myalgias, joint pain, or weakness Hematologic: Negative; no easy bruising, bleeding Endocrine: Negative; no heat/cold intolerance; no diabetes, Neuro: Negative; no changes in balance, headaches Skin: Negative; No rashes or skin lesions Psychiatric: Negative; No behavioral problems, depression Sleep:  Negative; No snoring,  daytime sleepiness, hypersomnolence, bruxism, restless legs, hypnogognic hallucinations. Other comprehensive 14 point system review is negative   Physical Exam BP 132/86 mmHg  Pulse 64  Ht 5' 5"  (1.651 m)  Wt 201 lb 9 oz (91.428 kg)  BMI 33.54 kg/m2 Wt Readings from Last 3 Encounters:  11/25/15 201 lb 9 oz (91.428 kg)  11/10/15 201 lb 6.4 oz (91.354 kg)  09/27/15 193 lb 8 oz (87.771 kg)   General: Alert, oriented, no distress.  Skin: normal turgor, no rashes, warm and dry HEENT: Normocephalic, atraumatic. Pupils equal round and reactive to light; sclera anicteric; extraocular muscles intact, No lid lag; Nose without nasal septal hypertrophy; Mouth/Parynx benign; Mallinpatti scale 3 Neck: No JVD, no carotid bruits; normal carotid upstroke Lungs:  clear to ausculatation and percussion bilaterally; no wheezing or rales, normal inspiratory and expiratory effort Chest wall: without tenderness to palpitation Heart: PMI not displaced, RRR, s1 s2 normal,  Very faint systolic murmur, No diastolic murmur, no rubs, gallops, thrills, or heaves Abdomen: soft, nontender; no hepatosplenomehaly, BS+; abdominal aorta nontender and not dilated by palpation. Back: no CVA tenderness Pulses: 2+  Musculoskeletal: full range of motion, normal strength, no joint deformities Extremities: Pulses 2+, no clubbing cyanosis or edema, Homan's sign negative  Neurologic: grossly nonfocal; Cranial nerves grossly wnl Psychologic: Normal mood and affect   ECG (independently read by me):  She refuse an ECG today since she had one done on March 6.  I have independently reviewed the March 6 ECG, which shows sinus bradycardia at 58 bpm with no ECG changes of a prior MI.  LABS:  BMP Latest Ref Rng 09/27/2015 09/26/2015 08/27/2015  Glucose 65 - 99 mg/dL 145(H) 108(H) 104(H)  BUN 6 - 20 mg/dL 17 17 7   Creatinine 0.44 - 1.00 mg/dL 1.02(H) 0.93 0.77  Sodium 135 - 145 mmol/L 143 142 142  Potassium 3.5  - 5.1 mmol/L 3.5 4.3 3.5  Chloride 101 - 111 mmol/L 107 105 104  CO2 22 - 32 mmol/L 27 25 29   Calcium 8.9 - 10.3 mg/dL 9.0 9.2 8.9     Hepatic Function Latest Ref Rng 09/27/2015 08/26/2015 08/26/2015  Total Protein 6.5 - 8.1 g/dL 6.3(L) 6.5 5.9(L)  Albumin 3.5 - 5.0 g/dL 3.4(L) 3.7 3.4(L)  AST 15 - 41 U/L 25 28 18   ALT 14 - 54 U/L 27 16 15   Alk Phosphatase 38 - 126 U/L 88 85 81  Total Bilirubin 0.3 - 1.2 mg/dL 0.5 0.5 0.2(L)    CBC Latest Ref Rng 09/27/2015 09/26/2015 08/27/2015  WBC 4.0 - 10.5 K/uL 8.6 8.9 13.6(H)  Hemoglobin 12.0 - 15.0 g/dL 12.1 12.3 13.2  Hematocrit 36.0 - 46.0 % 36.1 36.7 39.4  Platelets 150 - 400 K/uL 259 256 263   Lab Results  Component Value Date   MCV 90.7 09/27/2015   MCV 90.8 09/26/2015   MCV 91.8 08/27/2015    Lab Results  Component Value Date   TSH 3.079 09/27/2015    BNP    Component Value Date/Time   BNP 51.1 08/26/2015 1927    ProBNP    Component Value Date/Time   PROBNP 87.6 03/14/2014 1410     Lipid Panel     Component Value Date/Time   CHOL 232* 08/27/2015 1220   TRIG 191* 08/27/2015 1220   HDL 48 08/27/2015 1220   CHOLHDL 4.8 08/27/2015 1220   VLDL 38 08/27/2015 1220   LDLCALC 146* 08/27/2015 1220     RADIOLOGY: No results found.    ASSESSMENT AND PLAN: Ms. Daylynn Stumpp is a 65 year old female who suffered an inferior ST segment myocardial infarction on 08/26/2015 and underwent successful PCI with DES stenting to a large dominant RCA.  Subsequent, she has not had recurrent ischemic chest pain symptomatology. She did develop possible musculoskeletal chest pain leading to reevaluation in January.  She also developed some mild [place] of breath symptoms which may have been secondary to Brilinta-induced adenosine changes.  This has improved with change to Plavix. She has had some recent blood pressure issues and has been on amlodipine 2.5 mg, and HCT 100/12.5 mg, metoprolol 25 mg twice a day.  I have recommended.   Additional titration of amlodipine to 5 mg daily.  She is now using a nicotine term patch  14 mg per 24 hours.  She has been on this for several months.  I've suggested she reduce this to 7 mg.  She has not smoked any cigarettes since her myocardial infarction. She continues to be on dual antiplatelet therapy.  I reviewed her echo Doppler study from January 2017 which showed normal systolic and diastolic function without regional wall motion abnormalities. Her BMI is 33.54, compatible with mild obesity. Have recommended weight reduction as well as increased exercise to at least 5 days per week for a minimum of 30 minutes at a time. I will see her in the office in follow-up in 3-4 months.   Time spent: 25 minutes  Troy Sine, MD, [facility]  11/30/2015 9:51 AM

## 2015-12-03 ENCOUNTER — Other Ambulatory Visit: Payer: Self-pay | Admitting: Gastroenterology

## 2015-12-03 DIAGNOSIS — R140 Abdominal distension (gaseous): Secondary | ICD-10-CM

## 2015-12-08 ENCOUNTER — Ambulatory Visit
Admission: RE | Admit: 2015-12-08 | Discharge: 2015-12-08 | Disposition: A | Discharge status: Home / Self Care | Payer: 59 | Source: Ambulatory Visit | Attending: Gastroenterology | Admitting: Gastroenterology

## 2015-12-08 DIAGNOSIS — R140 Abdominal distension (gaseous): Secondary | ICD-10-CM

## 2015-12-09 ENCOUNTER — Other Ambulatory Visit: Payer: Self-pay | Admitting: *Deleted

## 2015-12-09 MED ORDER — LOSARTAN POTASSIUM-HCTZ 100-12.5 MG PO TABS: 1.0000 | ORAL_TABLET | Freq: Every day | ORAL | Status: DC

## 2015-12-10 ENCOUNTER — Other Ambulatory Visit: Payer: Self-pay | Admitting: *Deleted

## 2015-12-10 MED ORDER — LOSARTAN POTASSIUM-HCTZ 100-12.5 MG PO TABS: 1.0000 | ORAL_TABLET | Freq: Every day | ORAL | Status: DC

## 2015-12-15 ENCOUNTER — Other Ambulatory Visit: Payer: Self-pay | Admitting: Gastroenterology

## 2015-12-15 DIAGNOSIS — K769 Liver disease, unspecified: Secondary | ICD-10-CM

## 2015-12-23 ENCOUNTER — Ambulatory Visit
Admission: RE | Admit: 2015-12-23 | Discharge: 2015-12-23 | Disposition: A | Discharge status: Home / Self Care | Payer: 59 | Source: Ambulatory Visit | Attending: Gastroenterology | Admitting: Gastroenterology

## 2015-12-23 DIAGNOSIS — K769 Liver disease, unspecified: Secondary | ICD-10-CM

## 2015-12-23 MED ORDER — GADOBENATE DIMEGLUMINE 529 MG/ML IV SOLN
18.0000 mL | Freq: Once | INTRAVENOUS | Status: AC | PRN
  Administered 2015-12-23: 18 mL via INTRAVENOUS

## 2016-01-15 ENCOUNTER — Telehealth: Payer: Self-pay | Admitting: Cardiovascular Disease

## 2016-01-15 DIAGNOSIS — E785 Hyperlipidemia, unspecified: Secondary | ICD-10-CM

## 2016-01-15 NOTE — Telephone Encounter (Signed)
New message   Pt C/O medication issue:  1. Name of Medication: Lipitor 80 mg   2. How are you currently taking this medication (dosage and times per day)? Evening after supper   3. Are you having a reaction (difficulty breathing--STAT)? Gain weight / shaking - broken [place] last night / muscle pain - arm / back   4. What is your medication issue? Wants off this medication

## 2016-01-15 NOTE — Telephone Encounter (Signed)
Returned call to discuss problems patient has been having w lipitor. She notes she has been taking for about 5 months. Has had some issues w/ continued back and shoulder pain, shaking and weakness in arms, dropping objects. States familiarity w/ SE's of this med and is asking to discontinue.  Pt informed me she would like to start a different med rather than doing a lower dose of the current med. Advised to stop taking today, and plan for 2 week statin holiday, would defer to pharmD for recommendations.

## 2016-01-15 NOTE — Telephone Encounter (Signed)
We could try Crestor 20mg  daily. Recheck labs 2-3 months after change and schedule appointment in lipid clinic if possible?

## 2016-01-16 NOTE — Telephone Encounter (Signed)
Spoke to patient - she will heed recommendations on holding lipitor, and give us update in 2 weeks on how she feels. She wants to discuss Crestor recommendation at that time.

## 2016-02-03 ENCOUNTER — Ambulatory Visit: Payer: 59 | Admitting: Cardiovascular Disease

## 2016-02-03 ENCOUNTER — Telehealth: Payer: Self-pay | Admitting: Cardiovascular Disease

## 2016-02-03 ENCOUNTER — Ambulatory Visit: Payer: 59 | Admitting: Cardiology

## 2016-02-03 NOTE — Telephone Encounter (Signed)
New message      Pt c/o Shortness Of Breath: STAT if SOB developed within the last 24 hours or pt is noticeably SOB on the phone  1. Are you currently SOB (can you hear that pt is SOB on the phone)? Yes   2. How long have you been experiencing SOB? For about three weeks  3. Are you SOB when sitting or when up moving around? sitting  4. Are you currently experiencing any other symptoms? The pt is concerned about the medications inferring with each other    Pt c/o medication issue:  1. Name of Medication: Metoprolol(stop taking last)  2. How are you currently taking this medication (dosage and times per day)? 25 mg po twice daily  3. Are you having a reaction (difficulty breathing--STAT)? no  4. What is your medication issue? The medications is inferring with her asthma medications, the pt states she is not to take all the other inferring medications

## 2016-02-03 NOTE — Telephone Encounter (Signed)
Patient c/o SOB, increased use of her albuterol in past month. She has gone from using albuterol "maybe 1-2 times a month" to "5 times a day". Pt thought that this problem was stemming from use of metoprolol, as she'd noted no problems prior to starting this med. States this issue had become worse, and so she had held the metoprolol last 2 days on her own. i informed patient Dr. Tresa EndoKelly would likely want to prescribe something in place of metoprolol. Pt also notes cough when lying down. She does not think she is fluid overloaded, denies any weight gain or swelling.  Pt aware that this would be best handled in the office by provider. Pt agreeable to being seen today to discuss w/ Dr. Tresa EndoKelly. Added to open slot at 2:30pm.

## 2016-02-12 ENCOUNTER — Telehealth: Payer: Self-pay | Admitting: Cardiovascular Disease

## 2016-02-12 DIAGNOSIS — E785 Hyperlipidemia, unspecified: Secondary | ICD-10-CM

## 2016-02-12 MED ORDER — ROSUVASTATIN CALCIUM 20 MG PO TABS: 20.0000 mg | ORAL_TABLET | Freq: Every day | ORAL | Status: DC

## 2016-02-12 NOTE — Telephone Encounter (Signed)
Rx was sent to pharmacy electronically, pt agreeable to start Crestor and to call if concerns.

## 2016-02-12 NOTE — Telephone Encounter (Signed)
Left msg to call.

## 2016-02-12 NOTE — Telephone Encounter (Signed)
Pt is calling in to speak with Harrold Donathathan about a statin medication. Please f/u with her.

## 2016-02-12 NOTE — Addendum Note (Signed)
Addended by: Regis BillPRATT, MELINDA B on: 02/12/2016 01:50 PM   Modules accepted: Orders

## 2016-02-12 NOTE — Telephone Encounter (Signed)
Harrold DonathNathan RN discussed with Dr Tresa EndoKelly and will change Atorvastatin to Crestor 20 mg daily secondary to myalgia's  Rx sent to pharmacy

## 2016-02-12 NOTE — Telephone Encounter (Signed)
Pt insisting to talk to you about her Statin drug.

## 2016-02-12 NOTE — Telephone Encounter (Signed)
Discussed w patient today. She had recommendation to start Livalo by PCP based on some recent cholesterol labs, but knew that we had previously discussed her restarting a statin. She'd had side effects from Atorva (myalgias) and pharmD suggested to start Crestor 20mg  daily w/ recheck of labs in 2-3 months. We discussed that this would be the best interval to recheck labwork, in order to get an accurate understanding of her corrected cholesterol. Pt was reminded of this recommendation and she agreed this would be the best next step. I notified her PCP office of plan and my discussion w patient. I informed pt Livalo could be a recommended option if she does not do well w/ new statin, but would prefer Dr. Tresa EndoKelly or clinical pharmacist here address this, as Dr. Tresa EndoKelly follows her cholesterol.

## 2016-02-16 ENCOUNTER — Ambulatory Visit (INDEPENDENT_AMBULATORY_CARE_PROVIDER_SITE_OTHER): Payer: 59 | Admitting: Cardiovascular Disease

## 2016-02-16 ENCOUNTER — Encounter: Payer: Self-pay | Admitting: Cardiovascular Disease

## 2016-02-16 VITALS — BP 116/70 | HR 80 | Ht 65.0 in | Wt 197.0 lb

## 2016-02-16 DIAGNOSIS — I10 Essential (primary) hypertension: Secondary | ICD-10-CM | POA: Diagnosis not present

## 2016-02-16 DIAGNOSIS — I2510 Atherosclerotic heart disease of native coronary artery without angina pectoris: Secondary | ICD-10-CM

## 2016-02-16 DIAGNOSIS — Z87891 Personal history of nicotine dependence: Secondary | ICD-10-CM

## 2016-02-16 DIAGNOSIS — E669 Obesity, unspecified: Secondary | ICD-10-CM

## 2016-02-16 DIAGNOSIS — Z789 Other specified health status: Secondary | ICD-10-CM

## 2016-02-16 DIAGNOSIS — Z9189 Other specified personal risk factors, not elsewhere classified: Secondary | ICD-10-CM

## 2016-02-16 MED ORDER — BISOPROLOL FUMARATE 5 MG PO TABS
2.5000 mg | ORAL_TABLET | Freq: Every day | ORAL | Status: DC
Start: 2016-02-16 — End: 2016-04-12

## 2016-02-16 NOTE — Patient Instructions (Signed)
Your physician recommends that you return for lab work in: 2-3 months.  Your physician has recommended you make the following change in your medication: start new prescription for bisoprolol as directed on the bottle. The replaces the metoprolol.  Your physician wants you to follow-up in: 6 months or sooner if needed. You will receive a reminder letter in the mail two months in advance. If you don't receive a letter, please call our office to schedule the follow-up appointment.  If you need a refill on your cardiac medications before your next appointment, please call your pharmacy.

## 2016-02-18 DIAGNOSIS — Z789 Other specified health status: Secondary | ICD-10-CM | POA: Insufficient documentation

## 2016-02-18 NOTE — Progress Notes (Signed)
Patient ID: Leslie Dorsey, female   DOB: 11/08/1950, 65 y.o.   MRN: [id number]    Primary MD: Dr. London Pepper  HPI: Leslie Dorsey is a 65 y.o. female who presents to the office today for a 3 month follow-upcardiology evaluation.  [name] has a long-standing history of tobacco abuse and started smoking at age 57 when living in [place].  She has a history of hypertension, hyperlipidemia, as well as obesity.  In December she began to  episodes of chest and back discomfort.  On December 2016 she developed severe chest pain and was found to have 3 mm of inferior ST segment elevation consistent with an inferior STEMI.  I performed emergent cardiac catheterization which revealed a 99% stenosis in a large dominant RCA.  She had minimal narrowing of 10 and 20% in her LAD.  She underwent successful PCI to her RCA and a 3.5 15 mm Resolute DES stent was inserted, postdilated 3.7 mm with reduction in the 99% stenosis to 0% and return of brisk TIMI-3 flow.   She presented to [facility] on 09/26/2015 with some vague chest pain and ruled out.  She was felt possibly to have pericarditis.  Troponins were negative.  A 2-D echo Doppler study showed mild LVH with normal systolic function without regional wall motion abnormality.  He was felt that her pain was musculoskeletal.  She was hypertensive and losartan was increased and amlodipine was added.  She was seen by Margarite Gouge on 11/10/2015  Follow-up.  She was having some vague dyspnea.  At that time her Brilinta was discontinued and she was changed to Plavix in the event that this may have been Brilinta mediated. She presents now for follow-up evaluation.  She denies recurrent chest pain.  Her breathing is better. She denies palpitations. She quit tobacco in December following her MI.   She had laboratory by Dr. Orland Mustard in March 2017 which I have reviewed.  She presents for evaluation.  Past Medical History  Diagnosis Date  . Essential hypertension   . ST  elevation (STEMI) myocardial infarction involving right coronary artery ([place])   . CAD (coronary artery disease)     a. Inf STEMI 08/2015 - inferior STEMI 08/2015 s/p DES to RCA.  . Tobacco abuse   . Obesity   . Hyperlipidemia   . Hyperglycemia     a. 08/2015: A1C 5.8.    Past Surgical History  Procedure Laterality Date  . Abdominal hysterectomy    . Hemorroidectomy    . Cardiac catheterization N/A 08/26/2015    Procedure: Left Heart Cath and Coronary Angiography;  Surgeon: Troy Sine, MD;  Location: [facility];  Service: Cardiovascular;  Laterality: N/A;  . Cardiac catheterization N/A 08/26/2015    Procedure: Coronary Stent Intervention;  Surgeon: Troy Sine, MD;  Location: [facility];  Service: Cardiovascular;  Laterality: N/A;    Allergies  Allergen Reactions  . Levofloxacin Anaphylaxis, Hives and Rash  . Morphine And Related Anaphylaxis  . Lipitor [Atorvastatin]     Myalgia     Current Outpatient Prescriptions  Medication Sig Dispense Refill  . albuterol (PROVENTIL HFA;VENTOLIN HFA) 108 (90 Base) MCG/ACT inhaler Inhale 1-2 puffs into the lungs every 6 (six) hours as needed for wheezing or shortness of breath.    Marland Kitchen amLODipine (NORVASC) 5 MG tablet Take 1 tablet (5 mg total) by mouth daily. 30 tablet 6  . aspirin EC 81 MG EC tablet Take 1 tablet (81 mg total)  by mouth daily.    . clonazePAM (KLONOPIN) 1 MG tablet Take 1 mg by mouth daily as needed for anxiety.   1  . clopidogrel (PLAVIX) 75 MG tablet Take 1 tablet (75 mg total) by mouth daily. 90 tablet 3  . Cyanocobalamin (VITAMIN B-12 PO) Take 1 tablet by mouth daily at 12 noon.    . cyclobenzaprine (FLEXERIL) 10 MG tablet Take 5 mg by mouth daily as needed for muscle spasms.   0  . loratadine (CLARITIN) 10 MG tablet Take 10 mg by mouth daily as needed for allergies.    Marland Kitchen losartan-hydrochlorothiazide (HYZAAR) 100-12.5 MG tablet Take 1 tablet by mouth daily. 90 tablet 3  . nicotine (NICODERM CQ -  DOSED IN MG/24 HOURS) 14 mg/24hr patch Place 1 patch (14 mg total) onto the skin daily. 28 patch 2  . nitroGLYCERIN (NITROSTAT) 0.4 MG SL tablet Place 1 tablet (0.4 mg total) under the tongue every 5 (five) minutes x 3 doses as needed for chest pain. 25 tablet 3  . Pyridoxine HCl (B-6 PO) Take 1 tablet by mouth daily.    . rosuvastatin (CRESTOR) 20 MG tablet Take 1 tablet (20 mg total) by mouth daily. 30 tablet 2  . bisoprolol (ZEBETA) 5 MG tablet Take 0.5 tablets (2.5 mg total) by mouth daily after breakfast. 15 tablet 6   No current facility-administered medications for this visit.    Social History   Social History  . Marital Status: Married    Spouse Name: N/A  . Number of Children: N/A  . Years of Education: N/A   Occupational History  . Not on file.   Social History Main Topics  . Smoking status: Former Smoker    Quit date: 08/26/2015  . Smokeless tobacco: Not on file  . Alcohol Use: No  . Drug Use: No  . Sexual Activity: Not on file   Other Topics Concern  . Not on file   Social History Narrative    Family History  Problem Relation Age of Onset  . Heart disease Mother   . Heart attack Mother     ROS General: Negative; No fevers, chills, or night sweats HEENT: Negative; No changes in vision or hearing, sinus congestion, difficulty swallowing Pulmonary: Negative; No cough, wheezing, shortness of breath, hemoptysis Cardiovascular: See HPI:  GI: Negative; No nausea, vomiting, diarrhea, or abdominal pain GU: Negative; No dysuria, hematuria, or difficulty voiding Musculoskeletal: Negative; no myalgias, joint pain, or weakness Hematologic: Negative; no easy bruising, bleeding Endocrine: Negative; no heat/cold intolerance; no diabetes, Neuro: Negative; no changes in balance, headaches Skin: Negative; No rashes or skin lesions Psychiatric: Negative; No behavioral problems, depression Sleep: Negative; No snoring,  daytime sleepiness, hypersomnolence, bruxism,  restless legs, hypnogognic hallucinations. Other comprehensive 14 point system review is negative   Physical Exam BP 116/70 mmHg  Pulse 80  Ht _0  (1.651 m)  Wt 197 lb (89.359 kg)  BMI 32.78 kg/m2 Wt Readings from Last 3 Encounters:  02/16/16 197 lb (89.359 kg)  11/25/15 201 lb 9 oz (91.428 kg)  11/10/15 201 lb 6.4 oz (91.354 kg)   General: Alert, oriented, no distress.  Skin: normal turgor, no rashes, warm and dry HEENT: Normocephalic, atraumatic. Pupils equal round and reactive to light; sclera anicteric; extraocular muscles intact, No lid lag; Nose without nasal septal hypertrophy; Mouth/Parynx benign; Mallinpatti scale 3 Neck: No JVD, no carotid bruits; normal carotid upstroke Lungs: clear to ausculatation and percussion bilaterally; no wheezing or rales, normal inspiratory and expiratory effort  Chest wall: without tenderness to palpitation Heart: PMI not displaced, RRR, s1 s2 normal,  Very faint systolic murmur, No diastolic murmur, no rubs, gallops, thrills, or heaves Abdomen: soft, nontender; no hepatosplenomehaly, BS+; abdominal aorta nontender and not dilated by palpation. Back: no CVA tenderness Pulses: 2+  Musculoskeletal: full range of motion, normal strength, no joint deformities Extremities: Pulses 2+, no clubbing cyanosis or edema, Homan's sign negative  Neurologic: grossly nonfocal; Cranial nerves grossly wnl Psychologic: Normal mood and affect  ECG (independently read by me):  Normal sinus rhythm at 80 bpm..  Normal intervals.  No ST segment changes.  I have independently reviewed the November 10, 2015  ECG, which shows sinus bradycardia at 58 bpm with no ECG changes of a prior MI.  LABS:  BMP Latest Ref Rng 09/27/2015 09/26/2015 08/27/2015  Glucose 65 - 99 mg/dL 145(H) 108(H) 104(H)  BUN 6 - 20 mg/dL _0 Creatinine 0.44 - 1.00 mg/dL 1.02(H) 0.93 0.77  Sodium 135 - 145 mmol/L 143 142 142  Potassium 3.5 - 5.1 mmol/L 3.5 4.3 3.5  Chloride 101 - 111 mmol/L 107  105 104  CO2 22 - 32 mmol/L _1 Calcium 8.9 - 10.3 mg/dL 9.0 9.2 8.9     Hepatic Function Latest Ref Rng 09/27/2015 08/26/2015 08/26/2015  Total Protein 6.5 - 8.1 g/dL 6.3(L) 6.5 5.9(L)  Albumin 3.5 - 5.0 g/dL 3.4(L) 3.7 3.4(L)  AST 15 - 41 U/L _2 ALT 14 - 54 U/L _3 Alk Phosphatase 38 - 126 U/L 88 85 81  Total Bilirubin 0.3 - 1.2 mg/dL 0.5 0.5 0.2(L)    CBC Latest Ref Rng 09/27/2015 09/26/2015 08/27/2015  WBC 4.0 - 10.5 K/uL 8.6 8.9 13.6(H)  Hemoglobin 12.0 - 15.0 g/dL 12.1 12.3 13.2  Hematocrit 36.0 - 46.0 % 36.1 36.7 39.4  Platelets 150 - 400 K/uL 259 256 263   Lab Results  Component Value Date   MCV 90.7 09/27/2015   MCV 90.8 09/26/2015   MCV 91.8 08/27/2015    Lab Results  Component Value Date   TSH 3.079 09/27/2015    BNP    Component Value Date/Time   BNP 51.1 08/26/2015 1927    ProBNP    Component Value Date/Time   PROBNP 87.6 03/14/2014 1410     Lipid Panel     Component Value Date/Time   CHOL 232* 08/27/2015 1220   TRIG 191* 08/27/2015 1220   HDL 48 08/27/2015 1220   CHOLHDL 4.8 08/27/2015 1220   VLDL 38 08/27/2015 1220   LDLCALC 146* 08/27/2015 1220     RADIOLOGY: No results found.    ASSESSMENT AND PLAN: Ms. Kaisley Stiverson is a 65 year old female who suffered an inferior ST segment myocardial infarction on 08/26/2015 and underwent successful PCI with DES stenting to a large dominant RCA.  She has not had recurrent ischemic chest pain symptomatology but developed possible musculoskeletal chest pain leading to reevaluation in January.  She also developed SOB which may have been secondary to Brilinta-induced adenosine changes which improved with change to Plavix.  She has not smoked any cigarettes since her myocardial infarction. She continues to be on dual antiplatelet therapy.  I reviewed her echo Doppler study from January 2017 which showed normal systolic and diastolic function without regional wall motion abnormalities. Her  BMI is 33.54, compatible with mild obesity.   She did not tolerate high potency atorvastatin and now is on Crestor 20 milligrams with coenzyme Q 10  4 hyperlipidemia. She stopped taking her metoprolol since she felt this exacerbated her asthma and she required more use of her inhaler. This explains that her resting pulse is now 80.  She has only been on amlodipine 5 mg and losartan HCT 100/12.5 mg for hypertension..  I have suggested a trial of bisoprolol 2.5 mg in the morning , which will also be helpful for her blood pressure and should not exacerbate wheezing. In addition, she states that she typically is drinking coffee all day and it is not uncommon for her to drink over 12 cups of coffee daily. I strongly recommended reduction of her caffeine intake. I reviewed the blood work that she had done in March by her primary physician. In 3 months, I am recommending repeat laboratory. I will see her in 6 months for cardiology reevaluation. Time spent: 25 minutes  Troy Sine, MD, [facility]  02/18/2016 1:06 PM

## 2016-02-24 ENCOUNTER — Other Ambulatory Visit: Payer: Self-pay

## 2016-02-24 MED ORDER — AMLODIPINE BESYLATE 5 MG PO TABS: 5.0000 mg | ORAL_TABLET | Freq: Every day | ORAL | Status: DC

## 2016-03-12 ENCOUNTER — Encounter: Payer: Self-pay | Admitting: Cardiovascular Disease

## 2016-04-11 ENCOUNTER — Other Ambulatory Visit: Payer: Self-pay | Admitting: Cardiovascular Disease

## 2016-04-12 ENCOUNTER — Other Ambulatory Visit: Payer: Self-pay | Admitting: Cardiovascular Disease

## 2016-04-12 ENCOUNTER — Telehealth: Payer: Self-pay | Admitting: Cardiovascular Disease

## 2016-04-12 MED ORDER — BISOPROLOL FUMARATE 5 MG PO TABS
2.5000 mg | ORAL_TABLET | Freq: Every day | ORAL | 3 refills | Status: DC
Start: 2016-04-12 — End: 2016-05-06

## 2016-04-12 NOTE — Telephone Encounter (Signed)
Mrs. Ok AnisKingston is calling because she is on a statin drug and her ankles are swollen and she is having pain in her legs and hips . She does not know what to do and she is feels like she gots to come off of this medication . States that she took it this morning , but will not continue to take it . Because she can not take the pain . Please call   Thanks

## 2016-04-12 NOTE — Telephone Encounter (Signed)
Returned call to patient. She is having very bad bilateral leg, elbow, and shoulder pain. It has increasingly worsened since starting the rosuvastatin in June. She said she was ok for a few weeks but now the pain is unbearable. She stated it is very hard to get up in the morning and sometimes lays in bed and cannot sleep because of the pain.  Patient would like to request a non-statin option and does not want to take the "statin" drugs anymore. She had already tried atorvastatin and had the same outcome. She says she will try whatever other cholesterol-lowering, nonstatin agents that her insurance will cover.  Will route to Dr Tresa EndoKelly and Pharmacy for advice.

## 2016-04-12 NOTE — Telephone Encounter (Signed)
Rx(s) sent to pharmacy electronically.  

## 2016-04-12 NOTE — Telephone Encounter (Signed)
Have her try ezetimibe 10 mg once daily.

## 2016-04-14 MED ORDER — EZETIMIBE 10 MG PO TABS: 10.0000 mg | ORAL_TABLET | Freq: Every day | ORAL | 3 refills | Status: DC

## 2016-04-14 NOTE — Telephone Encounter (Signed)
No answer. Left message to call back.   

## 2016-04-14 NOTE — Telephone Encounter (Signed)
Returned call to patient. She was agreeable to trying the ezetimibe. Instructed her to take  (1 tablet) by mouth daily. She verbalized understanding. Rx sent to pharmacy.

## 2016-04-27 NOTE — Telephone Encounter (Signed)
agree

## 2016-05-05 ENCOUNTER — Other Ambulatory Visit: Payer: Self-pay | Admitting: *Deleted

## 2016-05-05 MED ORDER — ROSUVASTATIN CALCIUM 20 MG PO TABS: 20.0000 mg | ORAL_TABLET | Freq: Every day | ORAL | 3 refills | Status: DC

## 2016-05-06 ENCOUNTER — Other Ambulatory Visit: Payer: Self-pay | Admitting: Cardiovascular Disease

## 2016-05-06 MED ORDER — BISOPROLOL FUMARATE 5 MG PO TABS
2.5000 mg | ORAL_TABLET | Freq: Every day | ORAL | 3 refills | Status: DC
Start: 2016-05-06 — End: 2017-06-07

## 2016-05-06 MED ORDER — ROSUVASTATIN CALCIUM 20 MG PO TABS: 20.0000 mg | ORAL_TABLET | Freq: Every day | ORAL | 3 refills | Status: DC

## 2016-05-19 ENCOUNTER — Ambulatory Visit: Payer: 59 | Admitting: Cardiovascular Disease

## 2016-10-20 ENCOUNTER — Other Ambulatory Visit: Payer: Self-pay | Admitting: Physician Assistant

## 2016-10-20 NOTE — Telephone Encounter (Signed)
Rx(s) sent to pharmacy electronically.  

## 2016-10-29 ENCOUNTER — Telehealth: Payer: Self-pay | Admitting: Cardiovascular Disease

## 2016-10-29 MED ORDER — CLOPIDOGREL BISULFATE 75 MG PO TABS: 75.0000 mg | ORAL_TABLET | Freq: Every day | ORAL | 0 refills | Status: DC

## 2016-10-29 NOTE — Telephone Encounter (Signed)
New Message    *STAT* If patient is at the pharmacy, call can be transferred to refill team.   1. Which medications need to be refilled? (please list name of each medication and dose if known)   clopidogrel (PLAVIX) 75 MG tablet   2. Which pharmacy/location (including street and city if local pharmacy) is medication to be sent to?[id number] - [place], [place] - [address]  3. Do they need a 30 day or 90 day supply? 30 day  Appt scheduled with Dr. Tresa EndoKelly for March 19

## 2016-11-22 ENCOUNTER — Ambulatory Visit: Payer: 59 | Admitting: Cardiovascular Disease

## 2016-11-27 ENCOUNTER — Other Ambulatory Visit: Payer: Self-pay | Admitting: Cardiovascular Disease

## 2016-12-07 ENCOUNTER — Ambulatory Visit (INDEPENDENT_AMBULATORY_CARE_PROVIDER_SITE_OTHER): Payer: 59 | Admitting: Cardiovascular Disease

## 2016-12-07 ENCOUNTER — Encounter: Payer: Self-pay | Admitting: Cardiovascular Disease

## 2016-12-07 VITALS — BP 112/62 | HR 70 | Ht 65.0 in | Wt 197.0 lb

## 2016-12-07 DIAGNOSIS — I2510 Atherosclerotic heart disease of native coronary artery without angina pectoris: Secondary | ICD-10-CM

## 2016-12-07 DIAGNOSIS — Z79899 Other long term (current) drug therapy: Secondary | ICD-10-CM | POA: Diagnosis not present

## 2016-12-07 DIAGNOSIS — E669 Obesity, unspecified: Secondary | ICD-10-CM

## 2016-12-07 DIAGNOSIS — R0609 Other forms of dyspnea: Secondary | ICD-10-CM

## 2016-12-07 DIAGNOSIS — I10 Essential (primary) hypertension: Secondary | ICD-10-CM | POA: Diagnosis not present

## 2016-12-07 DIAGNOSIS — I739 Peripheral vascular disease, unspecified: Secondary | ICD-10-CM

## 2016-12-07 DIAGNOSIS — Z720 Tobacco use: Secondary | ICD-10-CM

## 2016-12-07 LAB — LIPID PANEL
Cholesterol: 208 mg/dL — ABNORMAL HIGH (ref <200)
HDL: 56 mg/dL (ref >50)
LDL CALC: 128 mg/dL — AB (ref <100)
Total CHOL/HDL Ratio: 3.7 Ratio (ref <5.0)
Triglycerides: 120 mg/dL (ref <150)
VLDL: 24 mg/dL (ref <30)

## 2016-12-07 LAB — COMPREHENSIVE METABOLIC PANEL
ALBUMIN: 3.9 g/dL (ref 3.6–5.1)
ALT: 14 U/L (ref 6–29)
AST: 18 U/L (ref 10–35)
Alkaline Phosphatase: 72 U/L (ref 33–130)
BILIRUBIN TOTAL: 0.5 mg/dL (ref 0.2–1.2)
BUN: 11 mg/dL (ref 7–25)
CHLORIDE: 105 mmol/L (ref 98–110)
CO2: 27 mmol/L (ref 20–31)
CREATININE: 0.84 mg/dL (ref 0.50–0.99)
Calcium: 9.3 mg/dL (ref 8.6–10.4)
GLUCOSE: 87 mg/dL (ref 65–99)
Potassium: 4.2 mmol/L (ref 3.5–5.3)
SODIUM: 142 mmol/L (ref 135–146)
Total Protein: 6.4 g/dL (ref 6.1–8.1)

## 2016-12-07 LAB — CBC
HCT: 38.6 % (ref 35.0–45.0)
HEMOGLOBIN: 12.4 g/dL (ref 11.7–15.5)
MCH: 28.8 pg (ref 27.0–33.0)
MCHC: 32.1 g/dL (ref 32.0–36.0)
MCV: 89.8 fL (ref 80.0–100.0)
MPV: 9.3 fL (ref 7.5–12.5)
PLATELETS: 310 10*3/uL (ref 140–400)
RBC: 4.3 MIL/uL (ref 3.80–5.10)
RDW: 13.6 % (ref 11.0–15.0)
WBC: 7.3 10*3/uL (ref 3.8–10.8)

## 2016-12-07 LAB — TSH: TSH: 1.56 m[IU]/L

## 2016-12-07 NOTE — Progress Notes (Signed)
Patient ID: Leslie Dorsey, female   DOB: February 06, 1951, 66 y.o.   MRN: [id number]    Primary MD: Dr. London Pepper  HPI: Leslie Dorsey is a 66 y.o. female who presents to the office today for a 10 month follow-upcardiology evaluation.  Ms. Leslie Dorsey has a long-standing history of tobacco abuse and started smoking at age 11 when living in [place].  She has a history of hypertension, hyperlipidemia, as well as obesity.  In December she began to  episodes of chest and back discomfort.  On December 2016 she developed severe chest pain and was found to have 3 mm of inferior ST segment elevation consistent with an inferior STEMI.  I performed emergent cardiac catheterization which revealed a 99% stenosis in a large dominant RCA.  She had minimal narrowing of 10 and 20% in her LAD.  She underwent successful PCI to her RCA and a 3.5 15 mm Resolute DES stent was inserted, postdilated 3.7 mm with reduction in the 99% stenosis to 0% and return of brisk TIMI-3 flow.   She presented to [facility] on 09/26/2015 with some vague chest pain and ruled out.  She was felt possibly to have pericarditis.  Troponins were negative.  A 2-D echo Doppler study showed mild LVH with normal systolic function without regional wall motion abnormality.  He was felt that her pain was musculoskeletal.  She was hypertensive and losartan was increased and amlodipine was added.  She was seen by Margarite Gouge on 11/10/2015  Follow-up.  She was having some vague dyspnea.  At that time her Brilinta was discontinued and she was changed to Plavix in the event that this may have been Brilinta mediated. She presents now for follow-up evaluation.  Since I last saw her, she had started back smoking again after having quit for 5 months.  She has been smoking for 53 years.  She has been under increased stress in that her son is in prison and her mother has significant dementia and has had several falls. She has noticed exertional shortness of breath as well as  occasional left leg cramps.  She continues to drink at least 4-5 cups of coffee per day.  She is unaware of recent palpitations.  She presents for reevaluation.  Past Medical History:  Diagnosis Date  . CAD (coronary artery disease)    a. Inf STEMI 08/2015 - inferior STEMI 08/2015 s/p DES to RCA.  . Essential hypertension   . Hyperglycemia    a. 08/2015: A1C 5.8.  Marland Kitchen Hyperlipidemia   . Obesity   . ST elevation (STEMI) myocardial infarction involving right coronary artery ([place])   . Tobacco abuse     Past Surgical History:  Procedure Laterality Date  . ABDOMINAL HYSTERECTOMY    . CARDIAC CATHETERIZATION N/A 08/26/2015   Procedure: Left Heart Cath and Coronary Angiography;  Surgeon: Troy Sine, MD;  Location: [facility];  Service: Cardiovascular;  Laterality: N/A;  . CARDIAC CATHETERIZATION N/A 08/26/2015   Procedure: Coronary Stent Intervention;  Surgeon: Troy Sine, MD;  Location: [facility];  Service: Cardiovascular;  Laterality: N/A;  . HEMORROIDECTOMY      Allergies  Allergen Reactions  . Brilinta [Ticagrelor] Shortness Of Breath    Difficulty breathing   . Levofloxacin Anaphylaxis, Hives and Rash  . Morphine And Related Anaphylaxis  . Lipitor [Atorvastatin]     Myalgia     Current Outpatient Prescriptions  Medication Sig Dispense Refill  . albuterol (PROVENTIL HFA;VENTOLIN HFA) 108 (  90 Base) MCG/ACT inhaler Inhale 1-2 puffs into the lungs every 6 (six) hours as needed for wheezing or shortness of breath.    Marland Kitchen amLODipine (NORVASC) 2.5 MG tablet Take 2.5 mg by mouth daily.  2  . aspirin EC 81 MG EC tablet Take 1 tablet (81 mg total) by mouth daily.    . bisoprolol (ZEBETA) 5 MG tablet Take 0.5 tablets (2.5 mg total) by mouth daily after breakfast. 45 tablet 3  . clonazePAM (KLONOPIN) 1 MG tablet Take 1 mg by mouth daily as needed for anxiety.   1  . clopidogrel (PLAVIX) 75 MG tablet TAKE 1 TABLET (75 MG TOTAL) BY MOUTH DAILY. MUST KEEP APPT FOR  FUTURE REFILLS 30 tablet 0  . Cyanocobalamin (VITAMIN B-12 PO) Take 1 tablet by mouth daily at 12 noon.    . cyclobenzaprine (FLEXERIL) 10 MG tablet Take 5 mg by mouth daily as needed for muscle spasms.   0  . ezetimibe (ZETIA) 10 MG tablet Take 1 tablet (10 mg total) by mouth daily. 90 tablet 3  . loratadine (CLARITIN) 10 MG tablet Take 10 mg by mouth daily as needed for allergies.    Marland Kitchen losartan-hydrochlorothiazide (HYZAAR) 100-25 MG tablet Take 1 tablet by mouth daily.    . nicotine (NICODERM CQ - DOSED IN MG/24 HOURS) 14 mg/24hr patch Place 1 patch (14 mg total) onto the skin daily. 28 patch 2  . nitroGLYCERIN (NITROSTAT) 0.4 MG SL tablet Place 1 tablet (0.4 mg total) under the tongue every 5 (five) minutes x 3 doses as needed for chest pain. 25 tablet 3  . Pyridoxine HCl (B-6 PO) Take 1 tablet by mouth daily.    . rosuvastatin (CRESTOR) 20 MG tablet Take 1 tablet (20 mg total) by mouth daily. 90 tablet 3   No current facility-administered medications for this visit.     Social History   Social History  . Marital status: Married    Spouse name: N/A  . Number of children: N/A  . Years of education: N/A   Occupational History  . Not on file.   Social History Main Topics  . Smoking status: Former Smoker    Quit date: 08/26/2015  . Smokeless tobacco: Never Used  . Alcohol use No  . Drug use: No  . Sexual activity: Not on file   Other Topics Concern  . Not on file   Social History Narrative  . No narrative on file    Family History  Problem Relation Age of Onset  . Heart disease Mother   . Heart attack Mother     ROS General: Negative; No fevers, chills, or night sweats HEENT: Negative; No changes in vision or hearing, sinus congestion, difficulty swallowing Pulmonary: Negative; No cough, wheezing, shortness of breath, hemoptysis Cardiovascular: See HPI:  GI: Negative; No nausea, vomiting, diarrhea, or abdominal pain GU: Negative; No dysuria, hematuria, or difficulty  voiding Musculoskeletal: Negative; no myalgias, joint pain, or weakness Hematologic: Negative; no easy bruising, bleeding Endocrine: Negative; no heat/cold intolerance; no diabetes, Neuro: Negative; no changes in balance, headaches Skin: Negative; No rashes or skin lesions Psychiatric: Negative; No behavioral problems, depression Sleep: Negative; No snoring,  daytime sleepiness, hypersomnolence, bruxism, restless legs, hypnogognic hallucinations. Other comprehensive 14 point system review is negative   Physical Exam BP 112/62   Pulse 70   Ht _0  (1.651 m)   Wt 197 lb (89.4 kg)   BMI 32.78 kg/m    Repeat BP 114/68  Wt Readings from Last  3 Encounters:  12/07/16 197 lb (89.4 kg)  02/16/16 197 lb (89.4 kg)  11/25/15 201 lb 9 oz (91.4 kg)   General: Alert, oriented, no distress.  Skin: normal turgor, no rashes, warm and dry HEENT: Normocephalic, atraumatic. Pupils equal round and reactive to light; sclera anicteric; extraocular muscles intact, No lid lag; Nose without nasal septal hypertrophy; Mouth/Parynx benign; Mallinpatti scale 3 Neck: No JVD, no carotid bruits; normal carotid upstroke Lungs: clear to ausculatation and percussion bilaterally; no wheezing or rales, normal inspiratory and expiratory effort Chest wall: without tenderness to palpitation Heart: PMI not displaced, RRR, s1 s2 normal,  Very faint systolic murmur, No diastolic murmur, no rubs, gallops, thrills, or heaves Abdomen: soft, nontender; no hepatosplenomehaly, BS+; abdominal aorta nontender and not dilated by palpation. Back: no CVA tenderness Pulses: 2+  Musculoskeletal: full range of motion, normal strength, no joint deformities Extremities: Pulses 2+, no clubbing cyanosis or edema, Homan's sign negative  Neurologic: grossly nonfocal; Cranial nerves grossly wnl Psychologic: Normal mood and affect  ECG (independently read by me): Normal sinus rhythm at 70 bpm.  No ectopy.  Normal intervals.  June 2017 ECG  (independently read by me):  Normal sinus rhythm at 80 bpm..  Normal intervals.  No ST segment changes.  I have independently reviewed the November 10, 2015  ECG, which shows sinus bradycardia at 58 bpm with no ECG changes of a prior MI.  LABS:  BMP Latest Ref Rng & Units 12/07/2016 09/27/2015 09/26/2015  Glucose 65 - 99 mg/dL 87 145(H) 108(H)  BUN 7 - 25 mg/dL _0 Creatinine 0.50 - 0.99 mg/dL 0.84 1.02(H) 0.93  Sodium 135 - 146 mmol/L 142 143 142  Potassium 3.5 - 5.3 mmol/L 4.2 3.5 4.3  Chloride 98 - 110 mmol/L 105 107 105  CO2 20 - 31 mmol/L _1 Calcium 8.6 - 10.4 mg/dL 9.3 9.0 9.2     Hepatic Function Latest Ref Rng & Units 12/07/2016 09/27/2015 08/26/2015  Total Protein 6.1 - 8.1 g/dL 6.4 6.3(L) 6.5  Albumin 3.6 - 5.1 g/dL 3.9 3.4(L) 3.7  AST 10 - 35 U/L _2 ALT 6 - 29 U/L _3 Alk Phosphatase 33 - 130 U/L 72 88 85  Total Bilirubin 0.2 - 1.2 mg/dL 0.5 0.5 0.5  Bilirubin, Direct - - - -    CBC Latest Ref Rng & Units 12/07/2016 09/27/2015 09/26/2015  WBC 3.8 - 10.8 K/uL 7.3 8.6 8.9  Hemoglobin 11.7 - 15.5 g/dL 12.4 12.1 12.3  Hematocrit 35.0 - 45.0 % 38.6 36.1 36.7  Platelets 140 - 400 K/uL 310 259 256   Lab Results  Component Value Date   MCV 89.8 12/07/2016   MCV 90.7 09/27/2015   MCV 90.8 09/26/2015    Lab Results  Component Value Date   TSH 1.56 12/07/2016    BNP    Component Value Date/Time   BNP 51.1 08/26/2015 1927    ProBNP    Component Value Date/Time   PROBNP 87.6 03/14/2014 1410     Lipid Panel     Component Value Date/Time   CHOL 208 (H) 12/07/2016 1133   TRIG 120 12/07/2016 1133   HDL 56 12/07/2016 1133   CHOLHDL 3.7 12/07/2016 1133   VLDL 24 12/07/2016 1133   LDLCALC 128 (H) 12/07/2016 1133     RADIOLOGY: No results found.  IMPRESSION:  1. CAD in native artery   2. Essential hypertension   3. Claudication in peripheral vascular  disease ([place])   4. DOE (dyspnea on exertion)   5. Medication management   6. Tobacco  use   7. Mild obesity     ASSESSMENT AND PLAN: Ms. Latunya Kissick is a 66 year old female who suffered an inferior ST segment myocardial infarction on 08/26/2015 and underwent successful PCI with DES stenting to a large dominant RCA. She developed SOB which may have been secondary to Brilinta-induced adenosine changes which improved with change to Plavix. She had initially quit smoking for 5 months following her myocardial infarction, but unfortunately over the past year is back smoking again, at least a pack per day. Her echo Doppler study from January 2017 showed normal systolic and diastolic function without regional wall motion abnormalities.  Blood pressure today is stable on a regimen consisting of amlodipine 2.5 mg, bisoprolol 2.5 mg, and losartan HCT 100/25 mg.  On rosuvastatin 20 mg and Zetia 10 mg but stopped rosuvastatin due to myalgias.  At times she still uses a nicotine patch.  She continues to be on dual antiplatelet therapy with aspirin and Plavix.  She admits to exertional shortness of breath as well as possible claudication symptoms.  He many complete set of laboratory be obtained.  I will reschedule her for a follow-up echo Doppler study.  She will undergo a lower extremity arterial Doppler study to assess for potential claudication symptoms.  With her long-standing tobacco history and prior CAD/MI, she is being referred also for Pope study to assess potential ischemia mediated dyspnea.  I will see her back in the office in follow-up of the above studies and further recommendations will be made at that time.  Time spent: 25 minutes  Troy Sine, MD, [facility]  12/09/2016 5:14 PM

## 2016-12-07 NOTE — Patient Instructions (Signed)
Your physician recommends that you return for lab work FASTING.  Your physician has requested that you have a lexiscan myoview. For further information please visit [email or web address] Please follow instruction sheet, as given.  Your physician has requested that you have a lower extremity arterial duplex. This test is an ultrasound of the arteries in the legs or arms. It looks at arterial blood flow in the legs. Allow one hour for Lower Arterial scans. There are no restrictions or special instructions.   Your physician has requested that you have an echocardiogram. Echocardiography is a painless test that uses sound waves to create images of your heart. It provides your doctor with information about the size and shape of your heart and how well your heart's chambers and valves are working. This procedure takes approximately one hour. There are no restrictions for this procedure.   Your physician recommends that you schedule a follow-up appointment in: 6-8 weeks.

## 2016-12-08 ENCOUNTER — Other Ambulatory Visit: Payer: Self-pay | Admitting: Cardiovascular Disease

## 2016-12-08 DIAGNOSIS — I739 Peripheral vascular disease, unspecified: Secondary | ICD-10-CM

## 2016-12-09 ENCOUNTER — Encounter: Payer: Self-pay | Admitting: Cardiovascular Disease

## 2016-12-09 ENCOUNTER — Telehealth (HOSPITAL_COMMUNITY): Payer: Self-pay

## 2016-12-09 NOTE — Telephone Encounter (Signed)
Encounter complete. 

## 2016-12-14 ENCOUNTER — Ambulatory Visit (HOSPITAL_COMMUNITY)
Admission: RE | Admit: 2016-12-14 | Discharge: 2016-12-14 | Disposition: A | Discharge status: Home / Self Care | Payer: 59 | Source: Ambulatory Visit | Attending: Cardiovascular Disease | Admitting: Cardiovascular Disease

## 2016-12-14 DIAGNOSIS — J45909 Unspecified asthma, uncomplicated: Secondary | ICD-10-CM | POA: Diagnosis not present

## 2016-12-14 DIAGNOSIS — I10 Essential (primary) hypertension: Secondary | ICD-10-CM | POA: Diagnosis present

## 2016-12-14 DIAGNOSIS — Z87891 Personal history of nicotine dependence: Secondary | ICD-10-CM | POA: Diagnosis not present

## 2016-12-14 DIAGNOSIS — Z8249 Family history of ischemic heart disease and other diseases of the circulatory system: Secondary | ICD-10-CM | POA: Insufficient documentation

## 2016-12-14 DIAGNOSIS — I739 Peripheral vascular disease, unspecified: Secondary | ICD-10-CM | POA: Insufficient documentation

## 2016-12-14 DIAGNOSIS — I2510 Atherosclerotic heart disease of native coronary artery without angina pectoris: Secondary | ICD-10-CM | POA: Insufficient documentation

## 2016-12-14 DIAGNOSIS — I252 Old myocardial infarction: Secondary | ICD-10-CM | POA: Diagnosis not present

## 2016-12-14 LAB — MYOCARDIAL PERFUSION IMAGING
CHL CUP RESTING HR STRESS: 62 {beats}/min
CSEPPHR: 99 {beats}/min

## 2016-12-14 MED ORDER — REGADENOSON 0.4 MG/5ML IV SOLN
0.4000 mg | Freq: Once | INTRAVENOUS | Status: AC
  Administered 2016-12-14: 0.4 mg via INTRAVENOUS

## 2016-12-14 MED ORDER — TECHNETIUM TC 99M TETROFOSMIN IV KIT
10.2000 | PACK | Freq: Once | INTRAVENOUS | Status: AC | PRN
  Administered 2016-12-14: 10.2 via INTRAVENOUS
  Filled 2016-12-14: qty 11

## 2016-12-14 MED ORDER — TECHNETIUM TC 99M TETROFOSMIN IV KIT
29.9000 | PACK | Freq: Once | INTRAVENOUS | Status: AC | PRN
  Administered 2016-12-14: 29.9 via INTRAVENOUS
  Filled 2016-12-14: qty 30

## 2016-12-20 ENCOUNTER — Telehealth: Payer: Self-pay | Admitting: *Deleted

## 2016-12-20 NOTE — Telephone Encounter (Signed)
-----   Message from Lennette Bihari, MD sent at 12/20/2016  9:45 AM EDT ----- Nl nuc

## 2016-12-20 NOTE — Telephone Encounter (Signed)
Patient notified of results.

## 2016-12-22 ENCOUNTER — Other Ambulatory Visit: Payer: Self-pay

## 2016-12-22 MED ORDER — CLOPIDOGREL BISULFATE 75 MG PO TABS: 75.0000 mg | ORAL_TABLET | Freq: Every day | ORAL | 3 refills | Status: DC

## 2016-12-22 NOTE — Telephone Encounter (Signed)
Rx(s) sent to pharmacy electronically.  

## 2016-12-23 ENCOUNTER — Telehealth: Payer: Self-pay | Admitting: Cardiovascular Disease

## 2016-12-23 ENCOUNTER — Other Ambulatory Visit: Payer: Self-pay

## 2016-12-23 ENCOUNTER — Ambulatory Visit (HOSPITAL_COMMUNITY): Payer: 59 | Attending: Cardiology

## 2016-12-23 DIAGNOSIS — I2510 Atherosclerotic heart disease of native coronary artery without angina pectoris: Secondary | ICD-10-CM | POA: Diagnosis not present

## 2016-12-23 DIAGNOSIS — Z87891 Personal history of nicotine dependence: Secondary | ICD-10-CM | POA: Diagnosis not present

## 2016-12-23 DIAGNOSIS — R0609 Other forms of dyspnea: Secondary | ICD-10-CM | POA: Diagnosis not present

## 2016-12-23 DIAGNOSIS — I10 Essential (primary) hypertension: Secondary | ICD-10-CM | POA: Diagnosis not present

## 2016-12-23 DIAGNOSIS — I252 Old myocardial infarction: Secondary | ICD-10-CM | POA: Diagnosis not present

## 2016-12-23 DIAGNOSIS — E785 Hyperlipidemia, unspecified: Secondary | ICD-10-CM | POA: Insufficient documentation
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
## 2016-12-23 NOTE — Telephone Encounter (Signed)
New message    Pt c/o medication issue:  1. Name of Medication: clopidogrel 75 mg  2. How are you currently taking this medication (dosage and times per day)? 1 Tab daily  3. Are you having a reaction (difficulty breathing--STAT)? No   4. What is your medication issue? Pt is calling stating that she was reading up on this medication and it causes bruising and blood clots. She said today is the last she will be taking this medication.    She said she is having an echo at 11:30 and call after that appt.

## 2016-12-24 NOTE — Telephone Encounter (Signed)
Will review with Dr. Tresa Endo on Monday.  In the meantime, please let her know that if she stops the medication she risks her stent closing up and causing another MI.  We understand that the bruising is problematic, but the medicine is truly necessary.

## 2016-12-24 NOTE — Telephone Encounter (Signed)
With her resumption again of smoking would not dc plavix

## 2016-12-24 NOTE — Telephone Encounter (Signed)
Spoke to the patient to advise her to continue taking her plavix and that we would be back in touch as soon as we received instruction from Dr. Tresa Endo. She verbalized her understanding and stated she would keep taking it but no longer wanted to be on it.

## 2016-12-24 NOTE — Telephone Encounter (Signed)
Returned the phone call to the patient to inform her that Dr. Tresa Endo wants her to continue taking her Plavix. She verbalized her understanding.

## 2016-12-27 ENCOUNTER — Encounter (HOSPITAL_COMMUNITY): Payer: 59

## 2016-12-28 IMAGING — MR MR ABDOMEN WO/W CM
11 of 17 series · 28 of 48 positions shown · IV contrast (18ml multihance)
Comparison: Ultrasound on 12/08/2015

CLINICAL DATA: Left hepatic lobe mass seen on recent ultrasound.
Generalized abdominal pain with nausea and vomiting for 1-2 months.

Creatinine was obtained on site at [HOSPITAL] at [HOSPITAL].Results: Creatinine 0.7 mg/dL.
EXAM:
MRI ABDOMEN WITHOUT AND WITH CONTRAST
TECHNIQUE: Multiplanar multisequence MR imaging of the abdomen was performed
both before and after the administration of intravenous contrast.
CONTRAST:  18mL MULTIHANCE GADOBENATE DIMEGLUMINE 529 MG/ML IV SOLN

[Series 3: cor haste · coronal · 5.0mm · 0.74mm/px · 2 of 32 slices shown]
[im 1/32]
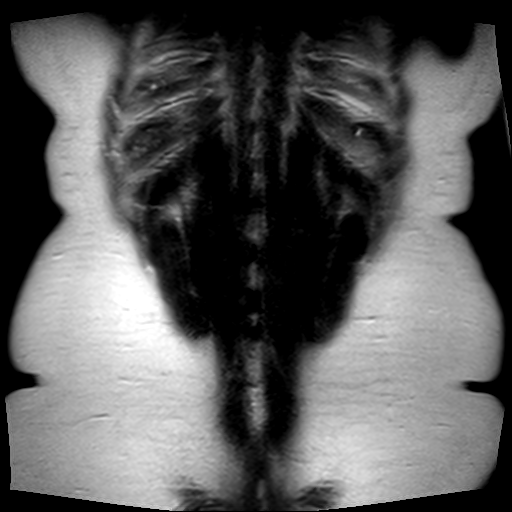
[im 32/32]
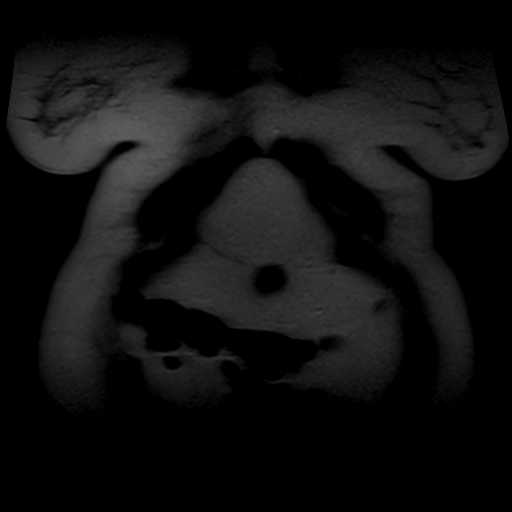

[Series 4: T1 · axial · 6.0mm · 0.74mm/px · z∈[-98,+87]mm · 3 of 58 slices shown]
[im 1/58]
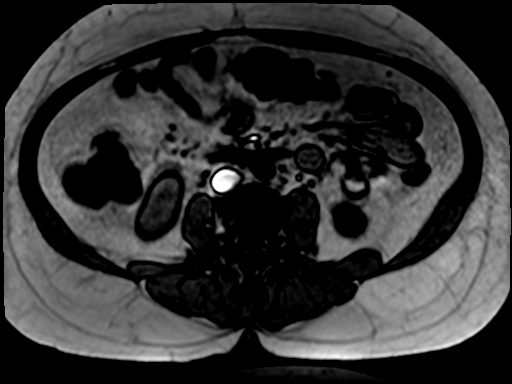
[im 29/58]
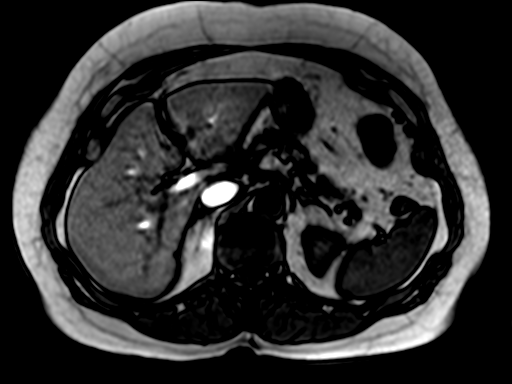
[im 58/58]
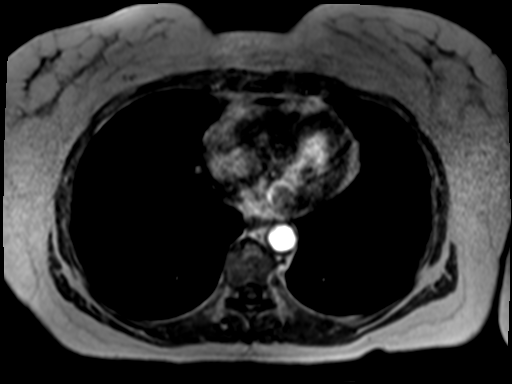

[Series 5: axial haste · axial · 6.0mm · 0.74mm/px · z∈[-98,+87]mm · 2 of 29 slices shown]
[im 1/29]
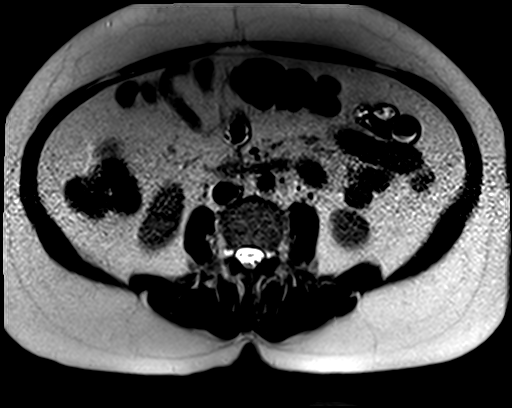
[im 29/29]
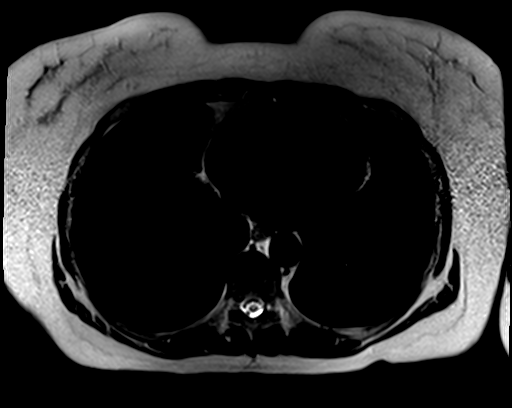

[Series 6: T2 · axial · 6.0mm · 0.99mm/px · z∈[-58,+127]mm · 2 of 29 slices shown]
[im 1/29]
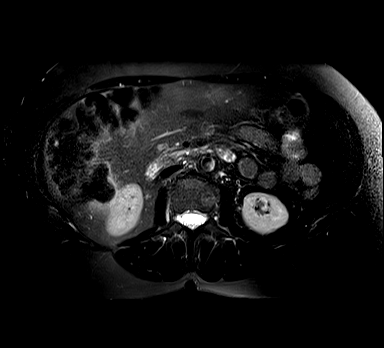
[im 29/29]
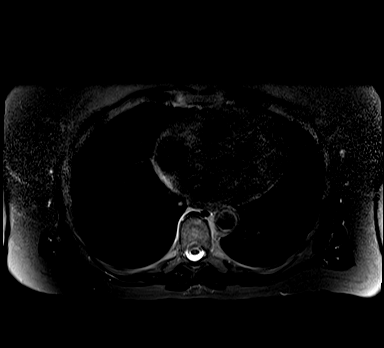

[Series 7: ep2d_[id number]_500_[id number]_trig · axial · 6.0mm · 1.98mm/px · z∈[-70,+115]mm · 5 of 87 slices shown]
[im 1/87]
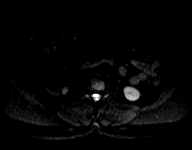
[im 22/87]
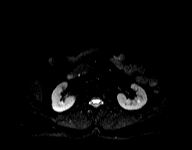
[im 44/87]
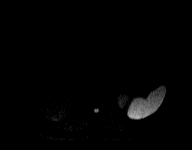
[im 65/87]
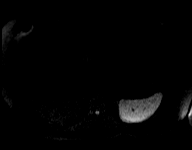
[im 87/87]
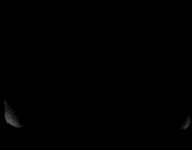

[Series 8: [id number]_trig_adc · axial · 6.0mm · 1.98mm/px · 1 of 29 slices shown]
[im 1/29]
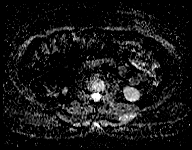

[Series 9: bSSFP · axial · 3.0mm · 0.74mm/px · z∈[-91,+86]mm · 3 of 60 slices shown]
[im 1/60]
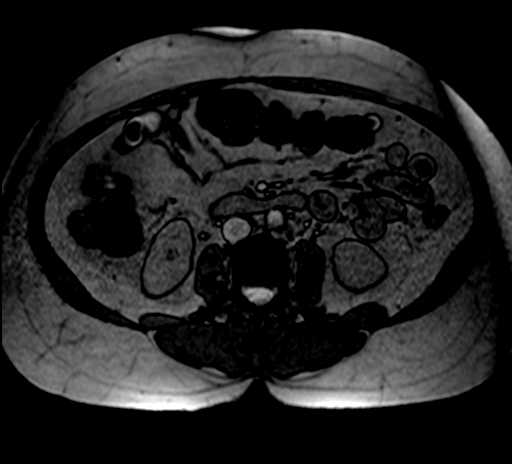
[im 30/60]
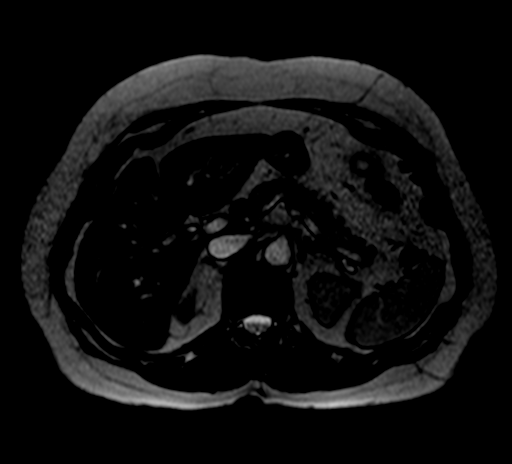
[im 60/60]
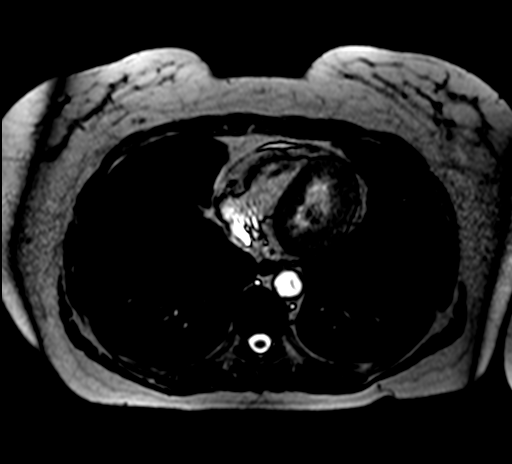

[Series 11: T1 dynamic · axial · non-contrast · 2.5mm · 0.74mm/px · z∈[-104,+94]mm · 3 of 80 slices shown]
[im 1/80]
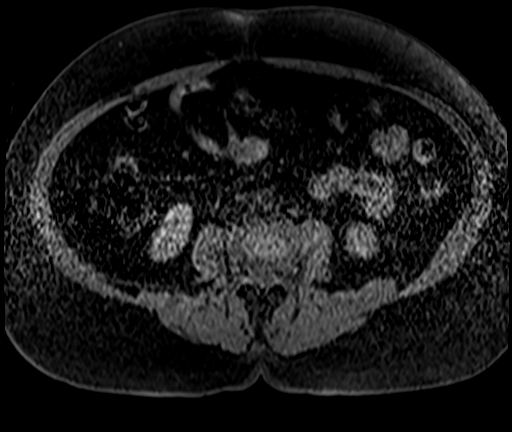
[im 40/80]
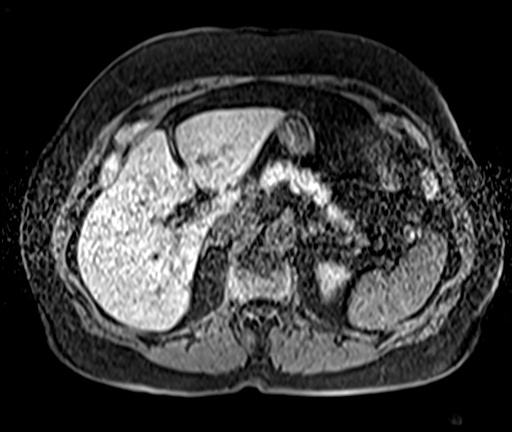
[im 80/80]
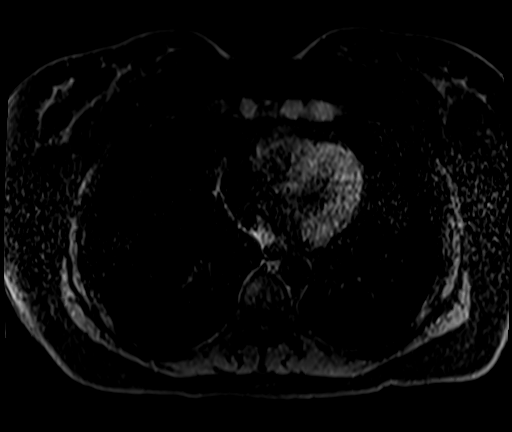

[Series 12: T1 dynamic post-contrast · axial · 2.5mm · 0.74mm/px · z∈[-104,+94]mm · 3 of 80 slices shown (1 of 3)]
[im 1/80]
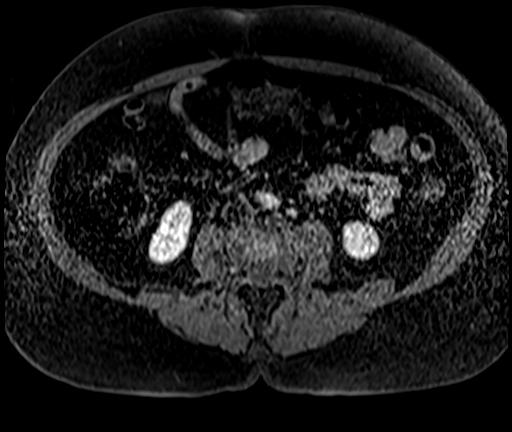
[im 40/80]
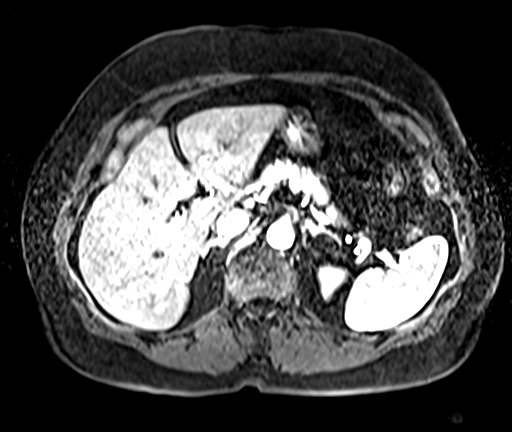
[im 80/80]
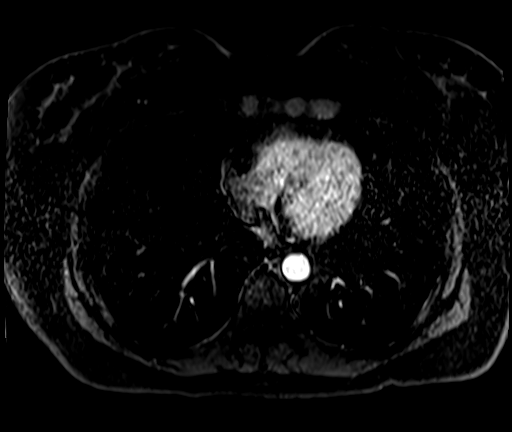

[Series 13: T1 dynamic post-contrast · axial · 2.5mm · 0.74mm/px · z∈[-104,+94]mm · 3 of 80 slices shown (2 of 3)]
[im 1/80]
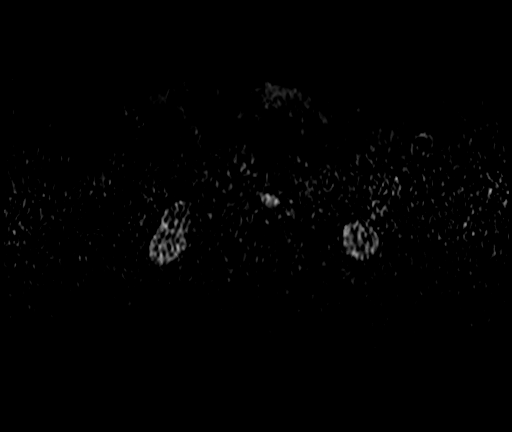
[im 40/80]
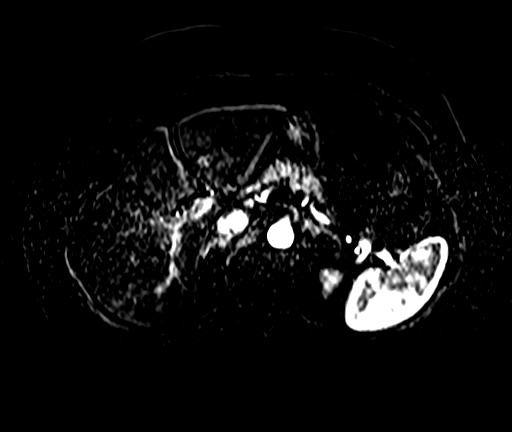
[im 80/80]
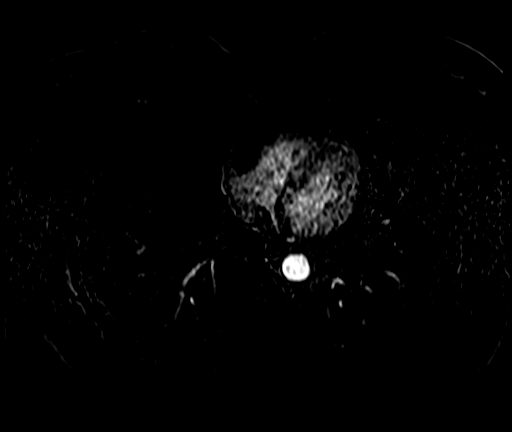

[Series 14: T1 dynamic post-contrast · axial · 2.5mm · 0.74mm/px · 1 of 80 slices shown (3 of 3)]
[im 1/80]
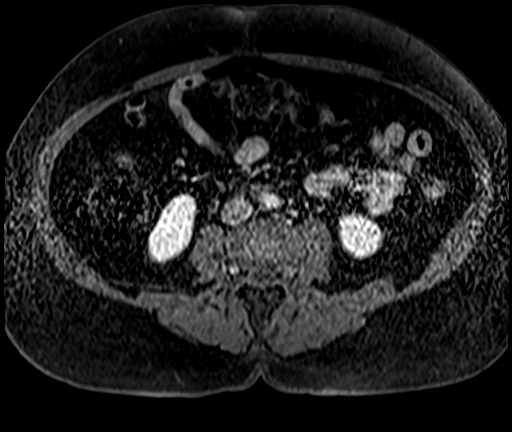

[28 of 48 positions shown; findings below may reference images not displayed]

FINDINGS: Lower chest:  No acute findings.

Hepatobiliary: A benign hemangioma is seen in segment 2 of the left
hepatic lobe which measures 2.2 cm corresponds with the hyperechoic
mass seen on recent ultrasound. A tiny sub-cm cysts is also seen in
anterior left hepatic lobe, segment 4A.

Gallbladder is unremarkable. No evidence of biliary ductal
dilatation.

Pancreas: No mass, inflammatory changes, or other parenchymal
abnormality identified.

Spleen:  Within normal limits in size and appearance.

Adrenals/Urinary Tract: No masses identified. A few tiny sub-cm
bilateral renal cysts are noted. No evidence of hydronephrosis.

Stomach/Bowel: Visualized portions within the abdomen are
unremarkable.

Vascular/Lymphatic: No pathologically enlarged lymph nodes
identified. No abdominal aortic aneurysm demonstrated.

Other:  None.

Musculoskeletal:  No suspicious bone lesions identified.
IMPRESSION: 2.2 cm benign hemangioma in the left hepatic lobe, corresponding
with mass seen on recent ultrasound. Additional tiny sub-cm hepatic
and bilateral renal cysts also noted.

No acute findings or other significant abnormality identified.

## 2017-01-10 ENCOUNTER — Telehealth: Payer: Self-pay | Admitting: *Deleted

## 2017-01-10 MED ORDER — ROSUVASTATIN CALCIUM 20 MG PO TABS: ORAL_TABLET | ORAL | 3 refills | Status: DC

## 2017-01-10 NOTE — Telephone Encounter (Signed)
-----   Message from Lennette Biharihomas A Kelly, MD sent at 12/26/2016  9:13 PM EDT ----- Labs stable, but lipid studies are elevated but improved.  She is on Zetia.  She may have had myalgias in the past with Cresto 20 mg.  Consider low-dose Crestor 10 mg 2 times per week if she is not taking this presently

## 2017-01-10 NOTE — Telephone Encounter (Signed)
Patient notified of lab results and recommendations. She agrees to take the rosuvastatin 10 mg twice a week.

## 2017-01-17 ENCOUNTER — Encounter (HOSPITAL_COMMUNITY): Payer: Self-pay

## 2017-02-22 ENCOUNTER — Telehealth: Payer: Self-pay | Admitting: Cardiovascular Disease

## 2017-02-22 NOTE — Telephone Encounter (Signed)
Patient informed that per pharmacy that there is no significant interaction between the Zetia and the red rice yeast.  If she can tolerate it, she needs to come back for a lipid panel in two months.

## 2017-02-22 NOTE — Telephone Encounter (Signed)
Patient called into state that she has developed muscle pain from taking the rosuvastatin 1/2 tablet twice a week. She has discontinued it. She is taking the Zetia 10 mg and has bought some red yeast rice to help with cholesterol. She would like to know if this will interfere or interact with the Zetia. Will route to pharmD for their recommendation.

## 2017-02-22 NOTE — Telephone Encounter (Signed)
No significant interactionj between Zetia and red rice yeast.  If able to tolerate; we will repeat Lipid panel in 2 months

## 2017-02-22 NOTE — Telephone Encounter (Signed)
New Message  Requesting call back  Pt c/o medication issue:  1. Name of Medication: rosuvastatin (CRESTOR) 20 MG tablet  2. How are you currently taking this medication (dosage and times per day)? Was taking as prescribed/Has stopped taking  3. Are you having a reaction (difficulty breathing--STAT)? No  4. What is your medication issue? Pain came back really bad, and wants to know if she can take Red yeast rice supplement along with the zetia

## 2017-04-13 ENCOUNTER — Other Ambulatory Visit: Payer: Self-pay | Admitting: Cardiovascular Disease

## 2017-04-13 NOTE — Telephone Encounter (Signed)
Rx(s) sent to pharmacy electronically.  

## 2017-04-27 ENCOUNTER — Other Ambulatory Visit: Payer: Self-pay | Admitting: Cardiovascular Disease

## 2017-06-07 ENCOUNTER — Ambulatory Visit (INDEPENDENT_AMBULATORY_CARE_PROVIDER_SITE_OTHER): Payer: 59 | Admitting: Cardiovascular Disease

## 2017-06-07 ENCOUNTER — Encounter: Payer: Self-pay | Admitting: Cardiovascular Disease

## 2017-06-07 VITALS — BP 116/78 | HR 78 | Ht 65.0 in | Wt 192.0 lb

## 2017-06-07 DIAGNOSIS — Z79899 Other long term (current) drug therapy: Secondary | ICD-10-CM

## 2017-06-07 DIAGNOSIS — I2510 Atherosclerotic heart disease of native coronary artery without angina pectoris: Secondary | ICD-10-CM | POA: Diagnosis not present

## 2017-06-07 DIAGNOSIS — Z720 Tobacco use: Secondary | ICD-10-CM

## 2017-06-07 DIAGNOSIS — I252 Old myocardial infarction: Secondary | ICD-10-CM

## 2017-06-07 DIAGNOSIS — E785 Hyperlipidemia, unspecified: Secondary | ICD-10-CM

## 2017-06-07 MED ORDER — PITAVASTATIN CALCIUM 2 MG PO TABS: 1.0000 mg | ORAL_TABLET | Freq: Every day | ORAL | 0 refills | Status: DC

## 2017-06-07 NOTE — Progress Notes (Signed)
Patient ID: Leslie Dorsey, female   DOB: May 09, 1951, 66 y.o.   MRN: [id number]    Primary MD: Dr. London Pepper  HPI: Leslie Dorsey is a 66 y.o. female who presents to the office today for a 6 month follow-upcardiology evaluation.  Leslie Dorsey has a long-standing history of tobacco abuse and started smoking at age 16 when living in [place].  She has a history of hypertension, hyperlipidemia, as well as obesity.  In December she began to  episodes of chest and back discomfort.  On December 2016 she developed severe chest pain and was found to have 3 mm of inferior ST segment elevation consistent with an inferior STEMI.  I performed emergent cardiac catheterization which revealed a 99% stenosis in a large dominant RCA.  She had minimal narrowing of 10 and 20% in her LAD.  She underwent successful PCI to her RCA and a 3.5 15 mm Resolute DES stent was inserted, postdilated 3.7 mm with reduction in the 99% stenosis to 0% and return of brisk TIMI-3 flow.   She presented to [facility] on 09/26/2015 with some vague chest pain and ruled out.  She was felt possibly to have pericarditis.  Troponins were negative.  A 2-D echo Doppler study showed mild LVH with normal systolic function without regional wall motion abnormality.  He was felt that her pain was musculoskeletal.  She was hypertensive and losartan was increased and amlodipine was added.  She was seen by Margarite Gouge on 11/10/2015  Follow-up.  She was having some vague dyspnea.  At that time her Brilinta was discontinued and she was changed to Plavix in the event that this may have been Brilinta mediated. She presents now for follow-up evaluation.  When I last saw her, she had started back smoking again after having quit for 5 months.  She has been smoking for 53 years.  She has been under increased stress in that her son is in prison and her mother has significant dementia and has had several falls. She has noticed exertional shortness of breath as well as  occasional left leg cramps.  She continues to drink at least 4-5 cups of coffee per day.  She is unaware of recent palpitations.    She underwent a nuclear stress test in April 2018 which was normal and showed an EF of 72% with normal perfusion and no ECG changes.  An echo Doppler study in April 19 showed an EF of 65-70% with grade 1 diastolic dysfunction.  She has reduced her smoking from 2 packs per day and is still smoking one half pack per day.  She has been intolerant to all statins.  She has been on Zetia 10 mg.  Her last lipid study from 6 months ago showed a total cholesterol of 208, which improved from 232 and her LDL was 128, improved from 146.  She presents for evaluation.  Past Medical History:  Diagnosis Date  . CAD (coronary artery disease)    a. Inf STEMI 08/2015 - inferior STEMI 08/2015 s/p DES to RCA.  . Essential hypertension   . Hyperglycemia    a. 08/2015: A1C 5.8.  Marland Kitchen Hyperlipidemia   . Obesity   . ST elevation (STEMI) myocardial infarction involving right coronary artery ([facility])   . Tobacco abuse     Past Surgical History:  Procedure Laterality Date  . ABDOMINAL HYSTERECTOMY    . CARDIAC CATHETERIZATION N/A 08/26/2015   Procedure: Left Heart Cath and Coronary Angiography;  Surgeon: Troy Sine,  MD;  Location: [facility];  Service: Cardiovascular;  Laterality: N/A;  . CARDIAC CATHETERIZATION N/A 08/26/2015   Procedure: Coronary Stent Intervention;  Surgeon: Troy Sine, MD;  Location: [facility];  Service: Cardiovascular;  Laterality: N/A;  . HEMORROIDECTOMY      Allergies  Allergen Reactions  . Brilinta [Ticagrelor] Shortness Of Breath    Difficulty breathing   . Levofloxacin Anaphylaxis, Hives and Rash  . Morphine And Related Anaphylaxis  . Lipitor [Atorvastatin]     Myalgia     Current Outpatient Prescriptions  Medication Sig Dispense Refill  . albuterol (PROVENTIL HFA;VENTOLIN HFA) 108 (90 Base) MCG/ACT inhaler Inhale 1-2 puffs into  the lungs every 6 (six) hours as needed for wheezing or shortness of breath.    Marland Kitchen amLODipine (NORVASC) 2.5 MG tablet Take 2.5 mg by mouth daily.  2  . aspirin EC 81 MG EC tablet Take 1 tablet (81 mg total) by mouth daily.    . clonazePAM (KLONOPIN) 1 MG tablet Take 1 mg by mouth daily as needed for anxiety.   1  . clopidogrel (PLAVIX) 75 MG tablet Take 1 tablet (75 mg total) by mouth daily. 90 tablet 3  . cyclobenzaprine (FLEXERIL) 10 MG tablet Take 5 mg by mouth daily as needed for muscle spasms.   0  . ezetimibe (ZETIA) 10 MG tablet TAKE 1 TABLET (10 MG TOTAL) BY MOUTH DAILY. 90 tablet 2  . loratadine (CLARITIN) 10 MG tablet Take 10 mg by mouth daily as needed for allergies.    Marland Kitchen losartan-hydrochlorothiazide (HYZAAR) 100-25 MG tablet Take 1 tablet by mouth daily.    . nicotine (NICODERM CQ - DOSED IN MG/24 HOURS) 14 mg/24hr patch Place 1 patch (14 mg total) onto the skin daily. 28 patch 2  . nitroGLYCERIN (NITROSTAT) 0.4 MG SL tablet PLACE 1 TABLET UNDER TONGUE EVERY 5 MINUTES FOR 3 DOSES AS NEEDED FOR CHEST PAIN 25 tablet 1  . Omega-3 Fatty Acids (FISH OIL) 600 MG CAPS Take 1 capsule by mouth as directed.    . Pyridoxine HCl (B-6 PO) Take 1 tablet by mouth daily.    . Red Yeast Rice Extract 300 MG CAPS Take 1 tablet by mouth as directed.    . Pitavastatin Calcium (LIVALO) 2 MG TABS Take 0.5 tablets (1 mg total) by mouth daily. 14 tablet 0   No current facility-administered medications for this visit.     Social History   Social History  . Marital status: Married    Spouse name: N/A  . Number of children: N/A  . Years of education: N/A   Occupational History  . Not on file.   Social History Main Topics  . Smoking status: Former Smoker    Quit date: 08/26/2015  . Smokeless tobacco: Never Used  . Alcohol use No  . Drug use: No  . Sexual activity: Not on file   Other Topics Concern  . Not on file   Social History Narrative  . No narrative on file    Family History    Problem Relation Age of Onset  . Heart disease Mother   . Heart attack Mother     ROS General: Negative; No fevers, chills, or night sweats HEENT: Negative; No changes in vision or hearing, sinus congestion, difficulty swallowing Pulmonary: Negative; No cough, wheezing, shortness of breath, hemoptysis Cardiovascular: See HPI:  GI: Negative; No nausea, vomiting, diarrhea, or abdominal pain GU: Negative; No dysuria, hematuria, or difficulty voiding Musculoskeletal: Negative; no  myalgias, joint pain, or weakness Hematologic: Negative; no easy bruising, bleeding Endocrine: Negative; no heat/cold intolerance; no diabetes, Neuro: Negative; no changes in balance, headaches Skin: Negative; No rashes or skin lesions Psychiatric: Negative; No behavioral problems, depression Sleep: Negative; No snoring,  daytime sleepiness, hypersomnolence, bruxism, restless legs, hypnogognic hallucinations. Other comprehensive 14 point system review is negative   Physical Exam BP 116/78   Pulse 78   Ht _0  (1.651 m)   Wt 192 lb (87.1 kg) Comment: pt states her home scales reads 178lb  BMI 31.95 kg/m    Repeat blood pressure 122/74  Wt Readings from Last 3 Encounters:  06/07/17 192 lb (87.1 kg)  12/14/16 197 lb (89.4 kg)  12/07/16 197 lb (89.4 kg)     Physical Exam BP 116/78   Pulse 78   Ht _1  (1.651 m)   Wt 192 lb (87.1 kg) Comment: pt states her home scales reads 178lb  BMI 31.95 kg/m  General: Alert, oriented, no distress.  Skin: normal turgor, no rashes, warm and dry HEENT: Normocephalic, atraumatic. Pupils equal round and reactive to light; sclera anicteric; extraocular muscles intact;  Nose without nasal septal hypertrophy Mouth/Parynx benign; Mallinpatti scale 3 Neck: No JVD, no carotid bruits; normal carotid upstroke Lungs: clear to ausculatation and percussion; no wheezing or rales Chest wall: without tenderness to palpitation Heart: PMI not displaced, RRR, s1 s2 normal,  faint 1/6 systolic murmur, no diastolic murmur, no rubs, gallops, thrills, or heaves Abdomen: soft, nontender; no hepatosplenomehaly, BS+; abdominal aorta nontender and not dilated by palpation. Back: no CVA tenderness Pulses 2+ Musculoskeletal: full range of motion, normal strength, no joint deformities Extremities: no clubbing cyanosis or edema, Homan's sign negative  Neurologic: grossly nonfocal; Cranial nerves grossly wnl Psychologic: Normal mood and affect   ECG (independently read by me): normal sinus rhythm at 78 bpm.  Normal intervals.  No ST segment changes.  April 2018 ECG (independently read by me): Normal sinus rhythm at 70 bpm.  No ectopy.  Normal intervals.  June 2017 ECG (independently read by me):  Normal sinus rhythm at 80 bpm..  Normal intervals.  No ST segment changes.  I have independently reviewed the November 10, 2015  ECG, which shows sinus bradycardia at 58 bpm with no ECG changes of a prior MI.  LABS:  BMP Latest Ref Rng & Units 12/07/2016 09/27/2015 09/26/2015  Glucose 65 - 99 mg/dL 87 145(H) 108(H)  BUN 7 - 25 mg/dL _2 Creatinine 0.50 - 0.99 mg/dL 0.84 1.02(H) 0.93  Sodium 135 - 146 mmol/L 142 143 142  Potassium 3.5 - 5.3 mmol/L 4.2 3.5 4.3  Chloride 98 - 110 mmol/L 105 107 105  CO2 20 - 31 mmol/L _3 Calcium 8.6 - 10.4 mg/dL 9.3 9.0 9.2     Hepatic Function Latest Ref Rng & Units 12/07/2016 09/27/2015 08/26/2015  Total Protein 6.1 - 8.1 g/dL 6.4 6.3(L) 6.5  Albumin 3.6 - 5.1 g/dL 3.9 3.4(L) 3.7  AST 10 - 35 U/L _4 ALT 6 - 29 U/L _5 Alk Phosphatase 33 - 130 U/L 72 88 85  Total Bilirubin 0.2 - 1.2 mg/dL 0.5 0.5 0.5  Bilirubin, Direct - - - -    CBC Latest Ref Rng & Units 12/07/2016 09/27/2015 09/26/2015  WBC 3.8 - 10.8 K/uL 7.3 8.6 8.9  Hemoglobin 11.7 - 15.5 g/dL 12.4 12.1 12.3  Hematocrit 35.0 - 45.0 % 38.6 36.1 36.7  Platelets 140 -  400 K/uL 310 259 256   Lab Results  Component Value Date   MCV 89.8 12/07/2016   MCV 90.7  09/27/2015   MCV 90.8 09/26/2015    Lab Results  Component Value Date   TSH 1.56 12/07/2016    BNP    Component Value Date/Time   BNP 51.1 08/26/2015 1927    ProBNP    Component Value Date/Time   PROBNP 87.6 03/14/2014 1410     Lipid Panel     Component Value Date/Time   CHOL 208 (H) 12/07/2016 1133   TRIG 120 12/07/2016 1133   HDL 56 12/07/2016 1133   CHOLHDL 3.7 12/07/2016 1133   VLDL 24 12/07/2016 1133   LDLCALC 128 (H) 12/07/2016 1133     RADIOLOGY: No results found.  IMPRESSION:  1. Hyperlipidemia, unspecified hyperlipidemia type   2. CAD in native artery   3. Status post inferior ST elevation MI treated with DES stenting of a large dominant RCA.   4. Medication management   5. Tobacco abuse     ASSESSMENT AND PLAN: Leslie Dorsey is a 67 year old female who suffered an inferior ST segment myocardial infarction on 08/26/2015 and underwent successful PCI with DES stenting to a large dominant RCA. She developed SOB which may have been secondary to Brilinta-induced adenosine changes which improved with change to Plavix. She had initially quit smoking for 5 months following her myocardial infarction, but unfortunately over the past year is back smoking again.  She is now smoking a half pack per day.  I reviewed her recent echo Doppler as well as nuclear stress test.  These confirmed normal systolic function.  There was grade 1 diastolic dysfunction.  She had normal perfusion.  I long discussion with her regarding the importance of LDL lowering.  I discussed with her outcome data with for Repatha and that she may very well be a candidate for PCSK9 inhibition therapy.  She has not been able to tolerate Crestor even low dose reports that she is been intolerant to other statins.  I will give her a trial of liver low to see if she can tolerate 1 mg every third day, and if so she will increase this to every other day and then daily.  Hopefully, we will then be able to  titrate to 2 mg.  Repeat blood work will be obtained in 3 months.  If she cannot tolerate this therapy and still cannot reach target therapy if she is able to target treatment, I will initiate the process for Repatha or Praluent depending upon her  Insurance.  I did discuss absolute smoking cessation.  Remotely, she had tried Chantix and had vivid dreams and has no interest in retrying this..  I will see her in 3-4 months for reevaluation.    Time spent: 25 minutes  Troy Sine, MD, [facility]  06/08/2017 7:31 PM

## 2017-06-07 NOTE — Patient Instructions (Signed)
Medication Instructions:  START Livalo 1 mg (1/2 tablet) 1st week-take 1/2 tablet every 3rd day 2nd week-increase to 1/2 tablet every other day 3rd week-increase to 1/2 tablet daily  Labwork: Please return for FASTING labs in 3 MONTHS (Lipid, CMET)  Our in office lab hours are Monday-Friday 8:00-4:30, closed for lunch 1-2 pm.  No appointment needed.  Follow-Up: Your physician wants you to follow-up in: 3 MONTHS with Dr. Tresa Endo. You will receive a reminder letter in the mail two months in advance. If you don't receive a letter, please call our office to schedule the follow-up appointment.    Any Other Special Instructions Will Be Listed Below (If Applicable).     If you need a refill on your cardiac medications before your next appointment, please call your pharmacy.

## 2017-06-29 ENCOUNTER — Telehealth: Payer: Self-pay | Admitting: Cardiovascular Disease

## 2017-06-29 DIAGNOSIS — E7800 Pure hypercholesterolemia, unspecified: Secondary | ICD-10-CM

## 2017-06-29 MED ORDER — ROSUVASTATIN CALCIUM 20 MG PO TABS: 20.0000 mg | ORAL_TABLET | Freq: Every day | ORAL | 3 refills | Status: DC

## 2017-06-29 NOTE — Telephone Encounter (Signed)
New message      Pt c/o medication issue:  1. Name of Medication:   ezetimibe (ZETIA) 10 MG tablet   Pitavastatin Calcium (LIVALO) 2 MG TABS   2. How are you currently taking this medication (dosage and times per day)?  She stopped taking both of these medications   3. Are you having a reaction (difficulty breathing--STAT)? yes  4. What is your medication issue? Muscle weakness , she started taking the rouvastatin again   Please call a prescription in for the rouvastatin into [facility] 90 day prescription

## 2017-06-29 NOTE — Telephone Encounter (Signed)
Spoke with pt, she was not able to tokerate the livalo or zetia. She has restarted the crestor at 20 mg daily. Lab orders mailed to the pt for check prior to follow up appointment.

## 2017-09-05 DIAGNOSIS — R739 Hyperglycemia, unspecified: Secondary | ICD-10-CM | POA: Insufficient documentation

## 2017-09-05 DIAGNOSIS — E785 Hyperlipidemia, unspecified: Secondary | ICD-10-CM | POA: Insufficient documentation

## 2017-09-05 DIAGNOSIS — Z720 Tobacco use: Secondary | ICD-10-CM | POA: Insufficient documentation

## 2017-09-05 DIAGNOSIS — I2510 Atherosclerotic heart disease of native coronary artery without angina pectoris: Secondary | ICD-10-CM | POA: Insufficient documentation

## 2017-09-05 DIAGNOSIS — I10 Essential (primary) hypertension: Secondary | ICD-10-CM | POA: Insufficient documentation

## 2017-09-16 ENCOUNTER — Encounter (INDEPENDENT_AMBULATORY_CARE_PROVIDER_SITE_OTHER): Payer: Self-pay

## 2017-09-16 ENCOUNTER — Encounter: Payer: Self-pay | Admitting: Cardiovascular Disease

## 2017-09-16 ENCOUNTER — Ambulatory Visit (INDEPENDENT_AMBULATORY_CARE_PROVIDER_SITE_OTHER): Payer: 59 | Admitting: Cardiovascular Disease

## 2017-09-16 VITALS — BP 120/62 | HR 78 | Ht 65.0 in | Wt 195.0 lb

## 2017-09-16 DIAGNOSIS — I252 Old myocardial infarction: Secondary | ICD-10-CM | POA: Diagnosis not present

## 2017-09-16 DIAGNOSIS — I10 Essential (primary) hypertension: Secondary | ICD-10-CM | POA: Diagnosis not present

## 2017-09-16 DIAGNOSIS — Z720 Tobacco use: Secondary | ICD-10-CM

## 2017-09-16 DIAGNOSIS — E785 Hyperlipidemia, unspecified: Secondary | ICD-10-CM | POA: Diagnosis not present

## 2017-09-16 DIAGNOSIS — I2510 Atherosclerotic heart disease of native coronary artery without angina pectoris: Secondary | ICD-10-CM | POA: Diagnosis not present

## 2017-09-16 MED ORDER — LOSARTAN POTASSIUM 100 MG PO TABS: 100.0000 mg | ORAL_TABLET | Freq: Every day | ORAL | 3 refills | Status: DC

## 2017-09-16 MED ORDER — ROSUVASTATIN CALCIUM 20 MG PO TABS: 20.0000 mg | ORAL_TABLET | Freq: Every day | ORAL | 3 refills | Status: DC

## 2017-09-16 MED ORDER — AMLODIPINE BESYLATE 2.5 MG PO TABS: 2.5000 mg | ORAL_TABLET | Freq: Every day | ORAL | 3 refills | Status: DC

## 2017-09-16 MED ORDER — CLOPIDOGREL BISULFATE 75 MG PO TABS: 75.0000 mg | ORAL_TABLET | Freq: Every day | ORAL | 3 refills | Status: DC

## 2017-09-16 NOTE — Progress Notes (Signed)
Patient ID: Leslie Dorsey, female   DOB: Apr 03, 1951, 67 y.o.   MRN: [id number]    Primary MD: Dr. London Pepper  HPI: Leslie Dorsey is a 67 y.o. female who presents to the office today for a 3  month follow-upcardiology evaluation.  [name] has a long-standing history of tobacco abuse and started smoking at age 64 when living in [place].  She has a history of hypertension, hyperlipidemia, as well as obesity.  In December she began to  episodes of chest and back discomfort.  On December 2016 she developed severe chest pain and was found to have 3 mm of inferior ST segment elevation consistent with an inferior STEMI.  I performed emergent cardiac catheterization which revealed a 99% stenosis in a large dominant RCA.  She had minimal narrowing of 10 and 20% in her LAD.  She underwent successful PCI to her RCA and a 3.5 15 mm Resolute DES stent was inserted, postdilated 3.7 mm with reduction in the 99% stenosis to 0% and return of brisk TIMI-3 flow.   She presented to [facility] on 09/26/2015 with some vague chest pain and ruled out.  She was felt possibly to have pericarditis.  Troponins were negative.  A 2-D echo Doppler study showed mild LVH with normal systolic function without regional wall motion abnormality.  He was felt that her pain was musculoskeletal.  She was hypertensive and losartan was increased and amlodipine was added.  She was seen by Margarite Gouge on 11/10/2015  Follow-up.  She was having some vague dyspnea.  At that time her Brilinta was discontinued and she was changed to Plavix in the event that this may have been Brilinta mediated. She presents now for follow-up evaluation.  When I last saw her, she had started back smoking again after having quit for 5 months.  She has been smoking for 53 years.  She has been under increased stress in that her son is in prison and her mother has significant dementia and has had several falls. She has noticed exertional shortness of breath as well as  occasional left leg cramps.  She continues to drink at least 4-5 cups of coffee per day.  She is unaware of recent palpitations.    A nuclear stress test in April 2018 was normal and showed an EF of 72% with normal perfusion and no ECG changes.  An echo Doppler study on April 19,2018 showed an EF of 65-70% with grade 1 diastolic dysfunction. .  She continues to smoke but reducedfrom 2 packs per day to one half pack per day.  In the past was reported statin intolerance.  When I last saw her, I recommended a retrial of rosuvastatin.  She actually has been able to tolerate this and is now taking 20 mg daily.  Her LDL was significantly reduced now at 65 from laboratory in December 2018 down from 146.  Recently, she has had difficulty with her hip which has minimized her walking.  At times she has noted some mild lightheadedness. She denies edema.  She is determined to quit smoking and has a smoking cessation date 11/23/2017.  She presents for evaluation.  Past Medical History:  Diagnosis Date  . CAD (coronary artery disease)    a. Inf STEMI 08/2015 - inferior STEMI 08/2015 s/p DES to RCA.  . Essential hypertension   . Hyperglycemia    a. 08/2015: A1C 5.8.  Marland Kitchen Hyperlipidemia   . Obesity   . ST elevation (STEMI) myocardial infarction involving  right coronary artery ([place])   . Tobacco abuse     Past Surgical History:  Procedure Laterality Date  . ABDOMINAL HYSTERECTOMY    . CARDIAC CATHETERIZATION N/A 08/26/2015   Procedure: Left Heart Cath and Coronary Angiography;  Surgeon: Troy Sine, MD;  Location: [facility];  Service: Cardiovascular;  Laterality: N/A;  . CARDIAC CATHETERIZATION N/A 08/26/2015   Procedure: Coronary Stent Intervention;  Surgeon: Troy Sine, MD;  Location: [facility];  Service: Cardiovascular;  Laterality: N/A;  . HEMORROIDECTOMY      Allergies  Allergen Reactions  . Brilinta [Ticagrelor] Shortness Of Breath    Difficulty breathing   . Levofloxacin  Anaphylaxis, Hives and Rash  . Morphine And Related Anaphylaxis  . Lipitor [Atorvastatin]     Myalgia     Current Outpatient Medications  Medication Sig Dispense Refill  . OMEGA-3 FATTY ACIDS PO Take 800 mg by mouth daily.    Marland Kitchen albuterol (PROVENTIL HFA;VENTOLIN HFA) 108 (90 Base) MCG/ACT inhaler Inhale 1-2 puffs into the lungs every 6 (six) hours as needed for wheezing or shortness of breath.    Marland Kitchen amLODipine (NORVASC) 2.5 MG tablet Take 1 tablet (2.5 mg total) by mouth daily. 90 tablet 3  . aspirin EC 81 MG EC tablet Take 1 tablet (81 mg total) by mouth daily.    . clonazePAM (KLONOPIN) 1 MG tablet Take 1 mg by mouth daily as needed for anxiety.   1  . clopidogrel (PLAVIX) 75 MG tablet Take 1 tablet (75 mg total) by mouth daily. 90 tablet 3  . cyclobenzaprine (FLEXERIL) 10 MG tablet Take 5 mg by mouth daily as needed for muscle spasms.   0  . loratadine (CLARITIN) 10 MG tablet Take 10 mg by mouth daily as needed for allergies.    Marland Kitchen losartan (COZAAR) 100 MG tablet Take 1 tablet (100 mg total) by mouth daily. 90 tablet 3  . nicotine (NICODERM CQ - DOSED IN MG/24 HOURS) 14 mg/24hr patch Place 1 patch (14 mg total) onto the skin daily. 28 patch 2  . nitroGLYCERIN (NITROSTAT) 0.4 MG SL tablet PLACE 1 TABLET UNDER TONGUE EVERY 5 MINUTES FOR 3 DOSES AS NEEDED FOR CHEST PAIN 25 tablet 1  . rosuvastatin (CRESTOR) 20 MG tablet Take 1 tablet (20 mg total) by mouth daily. 90 tablet 3   No current facility-administered medications for this visit.     Social History   Socioeconomic History  . Marital status: Married    Spouse name: Not on file  . Number of children: Not on file  . Years of education: Not on file  . Highest education level: Not on file  Social Needs  . Financial resource strain: Not on file  . Food insecurity - worry: Not on file  . Food insecurity - inability: Not on file  . Transportation needs - medical: Not on file  . Transportation needs - non-medical: Not on file    Occupational History  . Not on file  Tobacco Use  . Smoking status: Former Smoker    Last attempt to quit: 08/26/2015    Years since quitting: 2.0  . Smokeless tobacco: Never Used  Substance and Sexual Activity  . Alcohol use: No  . Drug use: No  . Sexual activity: Not on file  Other Topics Concern  . Not on file  Social History Narrative  . Not on file    Family History  Problem Relation Age of Onset  . Heart disease  Mother   . Heart attack Mother     ROS General: Negative; No fevers, chills, or night sweats HEENT: Negative; No changes in vision or hearing, sinus congestion, difficulty swallowing Pulmonary: Negative; No cough, wheezing, shortness of breath, hemoptysis Cardiovascular: See HPI:  GI: Negative; No nausea, vomiting, diarrhea, or abdominal pain GU: Negative; No dysuria, hematuria, or difficulty voiding Musculoskeletal: Hip discomfort Hematologic: Negative; no easy bruising, bleeding Endocrine: Negative; no heat/cold intolerance; no diabetes, Neuro: Negative; no changes in balance, headaches Skin: Negative; No rashes or skin lesions Psychiatric: Negative; No behavioral problems, depression Sleep: Negative; No snoring,  daytime sleepiness, hypersomnolence, bruxism, restless legs, hypnogognic hallucinations. Other comprehensive 14 point system review is negative   Physical Exam BP 120/62   Pulse 78   Ht 5' 5"  (1.651 m)   Wt 195 lb (88.5 kg)   BMI 32.45 kg/m    Repeat blood pressure by me was 105/70 supine and 90/65 standing.  Wt Readings from Last 3 Encounters:  09/16/17 195 lb (88.5 kg)  06/07/17 192 lb (87.1 kg)  12/14/16 197 lb (89.4 kg)    General: Alert, oriented, no distress.  Skin: normal turgor, no rashes, warm and dry HEENT: Normocephalic, atraumatic. Pupils equal round and reactive to light; sclera anicteric; extraocular muscles intact;  Nose without nasal septal hypertrophy Mouth/Parynx benign; Mallinpatti scale 3 Neck: No JVD, no  carotid bruits; normal carotid upstroke Lungs: clear to ausculatation and percussion; no wheezing or rales Chest wall: without tenderness to palpitation Heart: PMI not displaced, RRR, s1 s2 normal, 1/6 systolic murmur, no diastolic murmur, no rubs, gallops, thrills, or heaves Abdomen: soft, nontender; no hepatosplenomehaly, BS+; abdominal aorta nontender and not dilated by palpation. Back: no CVA tenderness Pulses 2+ Musculoskeletal: full range of motion, normal strength, no joint deformities Extremities: no clubbing cyanosis or edema, Homan's sign negative  Neurologic: grossly nonfocal; Cranial nerves grossly wnl Psychologic: Normal mood and affect   ECG (independently read by me): Normal sinus rhythm at 78 bpm.  No ectopy.  Normal intervals.  October 2018 ECG (independently read by me): normal sinus rhythm at 78 bpm.  Normal intervals.  No ST segment changes.  April 2018 ECG (independently read by me): Normal sinus rhythm at 70 bpm.  No ectopy.  Normal intervals.  June 2017 ECG (independently read by me):  Normal sinus rhythm at 80 bpm..  Normal intervals.  No ST segment changes.  I have independently reviewed the November 10, 2015  ECG, which shows sinus bradycardia at 58 bpm with no ECG changes of a prior MI.  LABS:   BMP Latest Ref Rng & Units 12/07/2016 09/27/2015 09/26/2015  Glucose 65 - 99 mg/dL 87 145(H) 108(H)  BUN 7 - 25 mg/dL 11 17 17   Creatinine 0.50 - 0.99 mg/dL 0.84 1.02(H) 0.93  Sodium 135 - 146 mmol/L 142 143 142  Potassium 3.5 - 5.3 mmol/L 4.2 3.5 4.3  Chloride 98 - 110 mmol/L 105 107 105  CO2 20 - 31 mmol/L 27 27 25   Calcium 8.6 - 10.4 mg/dL 9.3 9.0 9.2     Hepatic Function Latest Ref Rng & Units 12/07/2016 09/27/2015 08/26/2015  Total Protein 6.1 - 8.1 g/dL 6.4 6.3(L) 6.5  Albumin 3.6 - 5.1 g/dL 3.9 3.4(L) 3.7  AST 10 - 35 U/L 18 25 28   ALT 6 - 29 U/L 14 27 16   Alk Phosphatase 33 - 130 U/L 72 88 85  Total Bilirubin 0.2 - 1.2 mg/dL 0.5 0.5 0.5  Bilirubin, Direct -  - - -  CBC Latest Ref Rng & Units 12/07/2016 09/27/2015 09/26/2015  WBC 3.8 - 10.8 K/uL 7.3 8.6 8.9  Hemoglobin 11.7 - 15.5 g/dL 12.4 12.1 12.3  Hematocrit 35.0 - 45.0 % 38.6 36.1 36.7  Platelets 140 - 400 K/uL 310 259 256   Lab Results  Component Value Date   MCV 89.8 12/07/2016   MCV 90.7 09/27/2015   MCV 90.8 09/26/2015    Lab Results  Component Value Date   TSH 1.56 12/07/2016    BNP    Component Value Date/Time   BNP 51.1 08/26/2015 1927    ProBNP    Component Value Date/Time   PROBNP 87.6 03/14/2014 1410     Lipid Panel     Component Value Date/Time   CHOL 208 (H) 12/07/2016 1133   TRIG 120 12/07/2016 1133   HDL 56 12/07/2016 1133   CHOLHDL 3.7 12/07/2016 1133   VLDL 24 12/07/2016 1133   LDLCALC 128 (H) 12/07/2016 1133   Most recent lipid panel from 08/09/2017: Total cholesterol 141, HDL 54, LDL 65, triglycerides 112.  RADIOLOGY: No results found.  IMPRESSION:  1. CAD in native artery   2. Status post inferior ST elevation MI treated with DES stenting of a large dominant RCA.   3. Essential hypertension   4. Tobacco abuse   5. Hyperlipidemia with target LDL less than 70     ASSESSMENT AND PLAN: Ms. Nakota Ackert is a 67 year old female who suffered an inferior ST segment myocardial infarction on 08/26/2015 and underwent successful PCI with DES stenting to a large dominant RCA. She developed SOB which may have been secondary to Brilinta-induced adenosine changes which improved with change to Plavix. She had initially quit smoking for 5 months following her myocardial infarction, but resumed smoking. Her recent echo Doppler and nuclear stress test from April 2018 confirmed normal systolic function.  There was grade 1 diastolic dysfunction.  She had normal perfusion.  I long discussion with her regarding the importance of LDL lowering.  When I last saw her, I recommended a retrial of statin therapy since.  Reportedly, she could not tolerate this in the  past.  She actually has been able to tolerate rosuvastatin and recently has been taking 20 mg daily.  Her most recent lipid studies done by Dr. Smith Robert showed significant improvement in LDL cholesterol on 08/09/2017 at 65.  She has had some difficulty with hip arthritis.  On exam today she is mildly orthostatic and her blood pressure is low.  I have recommended discontinuance of her current losartan HCT 100/25 mg and since there is no edema.  She will change this to just losartan 100 mg daily.  She continues to be on low-dose amlodipine 2.5 mg.  She is on aspirin and Plavix for antiplatelet benefit.  She is now committed to discontinue smoking and on her [birth year] on 11/23/2018 will be her quit date.  I will see her in 6 months for reevaluation. Time spent: 25 minutes  Troy Sine, MD, [facility]  09/18/2017 9:17 AM

## 2017-09-16 NOTE — Patient Instructions (Signed)
Medication Instructions:  STOP Losartan/HCTZ  START Losartan 100 mg daily  Follow-Up: Your physician wants you to follow-up in: 6 months with Dr. Tresa EndoKelly. You will receive a reminder letter in the mail two months in advance. If you don't receive a letter, please call our office to schedule the follow-up appointment.   Any Other Special Instructions Will Be Listed Below (If Applicable).     If you need a refill on your cardiac medications before your next appointment, please call your pharmacy.

## 2017-09-18 ENCOUNTER — Encounter: Payer: Self-pay | Admitting: Cardiovascular Disease

## 2017-09-20 NOTE — Addendum Note (Signed)
Addended by: Raelyn NumberWILLIAMSON, Keyari Kleeman L on: 09/20/2017 09:47 AM   Modules accepted: Orders

## 2017-11-14 ENCOUNTER — Telehealth: Payer: Self-pay | Admitting: Cardiovascular Disease

## 2017-11-14 NOTE — Telephone Encounter (Signed)
If patient has been on 21 mg, consider reducing dose to 14 mg and ultimately down to 7 mg to allow hopefully for complete smoking cessation

## 2017-11-14 NOTE — Telephone Encounter (Signed)
New message      *STAT* If patient is at the pharmacy, call can be transferred to refill team.   1. Which medications need to be refilled? (please list name of each medication and dose if known) nicotine patches 21 mg  2. Which pharmacy/location (including street and city if local pharmacy) is medication to be sent to? [facility] [place]   3. Do they need a 30 day or 90 day supply? 30

## 2017-11-15 NOTE — Telephone Encounter (Signed)
F/U Call:  Patient calling, back for refill on patches. Patient states that she would like the "round, pink patches" because they adhere to the skin. Patient does not want the clear patches, as they do not stick to the skin as well.

## 2017-11-15 NOTE — Telephone Encounter (Signed)
That's fine

## 2017-11-16 MED ORDER — NICOTINE 21 MG/24HR TD PT24: 21.0000 mg | MEDICATED_PATCH | Freq: Every day | TRANSDERMAL | 0 refills | Status: DC

## 2017-11-16 NOTE — Telephone Encounter (Signed)
Left a message to call back to clarify how long she has been using the 21 mg patch because it may be time to wean down to the 14 mg.

## 2017-11-16 NOTE — Telephone Encounter (Signed)
Spoke with patient and she has recently started using [place] 21 mg patches since she trying to quit smoking again and would like a rx for 21 mg dosage instead of 14 mg. Patient said that she had a few 21 mg patches leftover from previous attempts.

## 2017-11-16 NOTE — Telephone Encounter (Signed)
The patient called in stating that she needs to start over with the Nicotine patch. 21 mg patch has been called in for her with instructions to switch the patch out daily for 6 weeks and then it will be time to taper down to the 14 mg patch for two weeks. She has verbalized her understanding.

## 2017-12-10 ENCOUNTER — Other Ambulatory Visit: Payer: Self-pay | Admitting: Cardiovascular Disease

## 2018-03-08 ENCOUNTER — Other Ambulatory Visit: Payer: Self-pay | Admitting: Cardiovascular Disease

## 2018-05-15 ENCOUNTER — Other Ambulatory Visit: Payer: Self-pay | Admitting: Family Medicine

## 2018-05-15 ENCOUNTER — Ambulatory Visit
Admission: RE | Admit: 2018-05-15 | Discharge: 2018-05-15 | Disposition: A | Discharge status: Home / Self Care | Payer: 59 | Source: Ambulatory Visit | Attending: Family Medicine | Admitting: Family Medicine

## 2018-05-15 DIAGNOSIS — R05 Cough: Secondary | ICD-10-CM

## 2018-05-15 DIAGNOSIS — R059 Cough, unspecified: Secondary | ICD-10-CM

## 2018-09-12 ENCOUNTER — Other Ambulatory Visit: Payer: Self-pay | Admitting: Family Medicine

## 2018-09-12 DIAGNOSIS — N644 Mastodynia: Secondary | ICD-10-CM

## 2018-09-15 ENCOUNTER — Ambulatory Visit
Admission: RE | Admit: 2018-09-15 | Discharge: 2018-09-15 | Disposition: A | Discharge status: Home / Self Care | Payer: 59 | Source: Ambulatory Visit | Attending: Family Medicine | Admitting: Family Medicine

## 2018-09-15 DIAGNOSIS — N644 Mastodynia: Secondary | ICD-10-CM

## 2018-09-24 ENCOUNTER — Other Ambulatory Visit: Payer: Self-pay | Admitting: Cardiovascular Disease

## 2018-09-25 NOTE — Telephone Encounter (Signed)
Rx has been sent to the pharmacy electronically. ° °

## 2018-10-02 ENCOUNTER — Other Ambulatory Visit: Payer: Self-pay | Admitting: Cardiovascular Disease

## 2018-10-08 ENCOUNTER — Other Ambulatory Visit: Payer: Self-pay | Admitting: Cardiovascular Disease

## 2018-10-09 NOTE — Telephone Encounter (Signed)
Rx request sent to pharmacy.  

## 2018-11-06 ENCOUNTER — Other Ambulatory Visit: Payer: Self-pay | Admitting: Cardiovascular Disease

## 2018-12-03 ENCOUNTER — Other Ambulatory Visit: Payer: Self-pay | Admitting: Cardiovascular Disease

## 2018-12-28 ENCOUNTER — Other Ambulatory Visit: Payer: Self-pay | Admitting: Cardiovascular Disease

## 2018-12-28 ENCOUNTER — Other Ambulatory Visit: Payer: Self-pay

## 2018-12-28 MED ORDER — ROSUVASTATIN CALCIUM 20 MG PO TABS: 20.0000 mg | ORAL_TABLET | Freq: Every day | ORAL | 0 refills | Status: DC

## 2018-12-28 MED ORDER — AMLODIPINE BESYLATE 2.5 MG PO TABS: 2.5000 mg | ORAL_TABLET | Freq: Every day | ORAL | 0 refills | Status: DC

## 2018-12-28 MED ORDER — LOSARTAN POTASSIUM 100 MG PO TABS: 100.0000 mg | ORAL_TABLET | Freq: Every day | ORAL | 0 refills | Status: DC

## 2018-12-28 MED ORDER — CLOPIDOGREL BISULFATE 75 MG PO TABS: ORAL_TABLET | ORAL | 1 refills | Status: DC

## 2018-12-28 NOTE — Telephone Encounter (Signed)
Follow Up:   Pt needs her Clopidogrel for 90 days and the rest of her prescriptions have to be for 90 days.Her insurance will only pay for 90 days- Pt has an appointment for August, that is the first available.

## 2018-12-28 NOTE — Telephone Encounter (Signed)
Crestor,  losartan and amlodipine refilled.

## 2018-12-28 NOTE — Telephone Encounter (Signed)
Clopidogrel refilled

## 2019-01-18 ENCOUNTER — Other Ambulatory Visit: Payer: Self-pay

## 2019-03-27 ENCOUNTER — Other Ambulatory Visit: Payer: Self-pay | Admitting: Cardiovascular Disease

## 2019-04-18 ENCOUNTER — Other Ambulatory Visit: Payer: Self-pay | Admitting: Cardiovascular Disease

## 2019-05-03 ENCOUNTER — Ambulatory Visit: Payer: 59 | Admitting: Cardiovascular Disease

## 2019-05-21 ENCOUNTER — Other Ambulatory Visit: Payer: Self-pay

## 2019-05-21 MED ORDER — NITROGLYCERIN 0.4 MG SL SUBL: SUBLINGUAL_TABLET | SUBLINGUAL | 1 refills | Status: DC

## 2019-05-27 ENCOUNTER — Other Ambulatory Visit: Payer: Self-pay | Admitting: Cardiovascular Disease

## 2019-06-17 ENCOUNTER — Other Ambulatory Visit: Payer: Self-pay | Admitting: Cardiovascular Disease

## 2019-07-11 ENCOUNTER — Other Ambulatory Visit: Payer: Self-pay | Admitting: Cardiovascular Disease

## 2019-07-13 ENCOUNTER — Other Ambulatory Visit: Payer: Self-pay | Admitting: Cardiovascular Disease

## 2019-07-24 ENCOUNTER — Ambulatory Visit (INDEPENDENT_AMBULATORY_CARE_PROVIDER_SITE_OTHER): Payer: 59 | Admitting: Cardiovascular Disease

## 2019-07-24 ENCOUNTER — Other Ambulatory Visit: Payer: Self-pay

## 2019-07-24 DIAGNOSIS — I2510 Atherosclerotic heart disease of native coronary artery without angina pectoris: Secondary | ICD-10-CM | POA: Diagnosis not present

## 2019-07-24 DIAGNOSIS — Z720 Tobacco use: Secondary | ICD-10-CM

## 2019-07-24 DIAGNOSIS — I10 Essential (primary) hypertension: Secondary | ICD-10-CM | POA: Diagnosis not present

## 2019-07-24 DIAGNOSIS — R252 Cramp and spasm: Secondary | ICD-10-CM | POA: Diagnosis not present

## 2019-07-24 DIAGNOSIS — I252 Old myocardial infarction: Secondary | ICD-10-CM | POA: Diagnosis not present

## 2019-07-24 DIAGNOSIS — E6609 Other obesity due to excess calories: Secondary | ICD-10-CM

## 2019-07-24 DIAGNOSIS — Z6834 Body mass index (BMI) 34.0-34.9, adult: Secondary | ICD-10-CM

## 2019-07-24 DIAGNOSIS — E66811 Other obesity due to excess calories: Secondary | ICD-10-CM

## 2019-07-24 DIAGNOSIS — E785 Hyperlipidemia, unspecified: Secondary | ICD-10-CM

## 2019-07-24 MED ORDER — EZETIMIBE 10 MG PO TABS: 10.0000 mg | ORAL_TABLET | Freq: Every day | ORAL | 3 refills | Status: DC

## 2019-07-24 NOTE — Progress Notes (Signed)
Patient ID: Leslie Dorsey, female   DOB: [birth year], 68 y.o.   MRN: [id number]    Primary MD: Dr. London Pepper  HPI: Leslie Dorsey is a 68 y.o. female who presents to the office today for a 22 month follow-upcardiology evaluation.  Leslie Dorsey has a long-standing history of tobacco abuse and started smoking at age 38 when living in [place].  She has a history of hypertension, hyperlipidemia, as well as obesity.  In December she began to  episodes of chest and back discomfort.  On December 2016 she developed severe chest pain and was found to have 3 mm of inferior ST segment elevation consistent with an inferior STEMI.  I performed emergent cardiac catheterization which revealed a 99% stenosis in a large dominant RCA.  She had minimal narrowing of 10 and 20% in her LAD.  She underwent successful PCI to her RCA and a 3.5 15 mm Resolute DES stent was inserted, postdilated 3.7 mm with reduction in the 99% stenosis to 0% and return of brisk TIMI-3 flow.   She [facility] to [facility] 09[facility] with some vague chest pain and ruled out.  She was felt possibly to have pericarditis.  Troponins were negative.  A 2-D echo Doppler study showed mild LVH with normal systolic function without regional wall motion abnormality.  He was felt that her pain was musculoskeletal.  She was hypertensive and losartan was increased and amlodipine was added.  She was seen by Margarite Gouge on 03/06/2017and was having some vague dyspnea.  At that time her Brilinta was discontinued and she was changed to Plavix in the event that this may have been Brilinta mediated. .  When I  saw her, she had started back smoking again after having quit for 5 months.  She has been smoking for 53 years.  She has been under increased stress in that her son is in prison and her mother has significant dementia and has had several falls. She has noticed exertional shortness of breath as well as occasional left leg cramps.  She continues to drink  at least 4-5 cups of coffee per day.  She is unaware of recent palpitations.    A nuclear stress test in April 2018 was normal and showed an EF of 72% with normal perfusion and no ECG changes.  An echo Doppler study on April 19,2018 showed an EF of 65-70% with grade 1 diastolic dysfunction. .  She continues to smoke but reducedfrom 2 packs per day to one half pack per day.  In the past was reported statin intolerance.  When I last saw her, I recommended a retrial of rosuvastatin.  She actually has been able to tolerate this and is now taking 20 mg daily.  Her LDL was significantly reduced now at 65 from laboratory in December 2018 down from 146.  I last saw her in January 2019 at which time she was having difficulty with her hip which has minimized her walking.  At times she noted some mild lightheadedness. She denied edema.  She was determined to quit smoking and had a smoking cessation date 11/23/2017.    Since I last saw her, unfortunately she still smokes and has been smoking since age 84 for a total of 84 years.  She has cut down her tobacco from 2 packs/day to approximately 3 cigarettes/day.  She is on amlodipine 2.5 mg, losartan 100 mg and HCTZ 25 mg for hypertension.  She continues to be on aspirin and Plavix.  She  has been taking rosuvastatin 10 mg and has been using a nicotine patch.  She admits to experiencing leg cramps with walking.  She has never had lower extremity Doppler studies.  She has not been very active.  She has not been successful with weight loss.  She presents for reevaluation.  Past Medical History:  Diagnosis Date  . CAD (coronary artery disease)    a. Inf STEMI 08/2015 - inferior STEMI 08/2015 s/p DES to RCA.  . Essential hypertension   . Hyperglycemia    a. 08/2015: A1C 5.8.  Marland Kitchen Hyperlipidemia   . Obesity   . ST elevation (STEMI) myocardial infarction involving right coronary artery ([address])   . Tobacco abuse     Past Surgical History:  Procedure Laterality Date  .  ABDOMINAL HYSTERECTOMY    . CARDIAC CATHETERIZATION N/A 08/26/2015   Procedure: Left Heart Cath and Coronary Angiography;  Surgeon: Troy Sine, MD;  Location: [place] [facility];  Service: Cardiovascular;  Laterality: N/A;  . CARDIAC CATHETERIZATION N/A 08/26/2015   Procedure: Coronary Stent Intervention;  Surgeon: Troy Sine, MD;  Location: [place] [facility];  Service: Cardiovascular;  Laterality: N/A;  . HEMORROIDECTOMY      Allergies  Allergen Reactions  . Brilinta [Ticagrelor] Shortness Of Breath    Difficulty breathing   . Levofloxacin Anaphylaxis, Hives and Rash  . Morphine And Related Anaphylaxis  . Lipitor [Atorvastatin]     Myalgia     Current Outpatient Medications  Medication Sig Dispense Refill  . albuterol (PROVENTIL HFA;VENTOLIN HFA) 108 (90 Base) MCG/ACT inhaler Inhale 1-2 puffs into the lungs every 6 (six) hours as needed for wheezing or shortness of breath.    Marland Kitchen amLODipine (NORVASC) 2.5 MG tablet TAKE 1 TABLET BY MOUTH EVERY DAY 90 tablet 0  . aspirin EC 81 MG EC tablet Take 1 tablet (81 mg total) by mouth daily.    . clonazePAM (KLONOPIN) 1 MG tablet Take 1 mg by mouth daily as needed for anxiety.   1  . clopidogrel (PLAVIX) 75 MG tablet TAKE 1 TABLET (75 MG TOTAL) BY MOUTH DAILY. KEEP OV. 90 tablet 0  . cyclobenzaprine (FLEXERIL) 10 MG tablet Take 5 mg by mouth daily as needed for muscle spasms.   0  . hydrochlorothiazide (HYDRODIURIL) 25 MG tablet Take 25 mg by mouth every morning.    Marland Kitchen ibuprofen (ADVIL) 800 MG tablet Take 400 mg by mouth 2 (two) times daily as needed.    . loratadine (CLARITIN) 10 MG tablet Take 10 mg by mouth daily as needed for allergies.    Marland Kitchen losartan (COZAAR) 100 MG tablet TAKE 1 TABLET (100 MG TOTAL) BY MOUTH DAILY. OFFICE VISIT NEEDED 30 tablet 1  . NICOTINE STEP 1 21 MG/24HR patch PLEASE SEE ATTACHED FOR DETAILED DIRECTIONS 42 patch 2  . nitroGLYCERIN (NITROSTAT) 0.4 MG SL tablet PLACE 1 TABLET UNDER TONGUE EVERY 5 MINUTES FOR 3  DOSES AS NEEDED FOR CHEST PAIN 25 tablet 1  . OMEGA-3 FATTY ACIDS PO Take 800 mg by mouth daily.    Marland Kitchen oxyCODONE-acetaminophen (PERCOCET) 10-325 MG tablet TAKE 1/4 TABLET FOUR TIMES A DAY AS NEEDED    . rosuvastatin (CRESTOR) 20 MG tablet TAKE 1 TABLET (20 MG TOTAL) BY MOUTH DAILY. OFFICE VISIT NEEDED (Patient taking differently: Take 20 mg by mouth daily. Patient takes 1/2) 30 tablet 1  . ezetimibe (ZETIA) 10 MG tablet Take 1 tablet (10 mg total) by mouth daily. 90 tablet 3   No current  facility-administered medications for this visit.     Social History   Socioeconomic History  . Marital status: Married    Spouse name: Not on file  . Number of children: Not on file  . Years of education: Not on file  . Highest education level: Not on file  Occupational History  . Not on file  Social Needs  . Financial resource strain: Not on file  . Food insecurity    Worry: Not on file    Inability: Not on file  . Transportation needs    Medical: Not on file    Non-medical: Not on file  Tobacco Use  . Smoking status: Former Smoker    Quit date: 08/26/2015    Years since quitting: 3.9  . Smokeless tobacco: Never Used  Substance and Sexual Activity  . Alcohol use: No  . Drug use: No  . Sexual activity: Not on file  Lifestyle  . Physical activity    Days per week: Not on file    Minutes per session: Not on file  . Stress: Not on file  Relationships  . Social Herbalist on phone: Not on file    Gets together: Not on file    Attends religious service: Not on file    Active member of club or organization: Not on file    Attends meetings of clubs or organizations: Not on file    Relationship status: Not on file  . Intimate partner violence    Fear of current or ex partner: Not on file    Emotionally abused: Not on file    Physically abused: Not on file    Forced sexual activity: Not on file  Other Topics Concern  . Not on file  Social History Narrative  . Not on file     Family History  Problem Relation Age of Onset  . Heart disease Mother   . Heart attack Mother     ROS General: Negative; No fevers, chills, or night sweats HEENT: Negative; No changes in vision or hearing, sinus congestion, difficulty swallowing Pulmonary: Negative; No cough, wheezing, shortness of breath, hemoptysis Cardiovascular: See HPI:  GI: Negative; No nausea, vomiting, diarrhea, or abdominal pain GU: Negative; No dysuria, hematuria, or difficulty voiding Musculoskeletal: Hip discomfort Hematologic: Negative; no easy bruising, bleeding Endocrine: Negative; no heat/cold intolerance; no diabetes, Neuro: Negative; no changes in balance, headaches Skin: Negative; No rashes or skin lesions Psychiatric: Negative; No behavioral problems, depression Sleep: Negative; No snoring,  daytime sleepiness, hypersomnolence, bruxism, restless legs, hypnogognic hallucinations. Other comprehensive 14 point system review is negative   Physical Exam BP 124/65   Pulse 60   Temp (!) 97 F (36.1 C)   Ht _0  (1.651 m)   Wt 205 lb (93 kg)   SpO2 97%   BMI 34.11 kg/m    Repeat blood pressure by me was 132/64  Wt Readings from Last 3 Encounters:  07/24/19 205 lb (93 kg)  09/16/17 195 lb (88.5 kg)  06/07/17 192 lb (87.1 kg)   General: Alert, oriented, no distress.  Skin: normal turgor, no rashes, warm and dry HEENT: Normocephalic, atraumatic. Pupils equal round and reactive to light; sclera anicteric; extraocular muscles intact;  Nose without nasal septal hypertrophy Mouth/Parynx benign; Mallinpatti scale 3 Neck: No JVD, no carotid bruits; normal carotid upstroke Lungs: clear to ausculatation and percussion; no wheezing or rales Chest wall: without tenderness to palpitation Heart: PMI not displaced, RRR, s1 s2 normal, 1/6 systolic murmur,  no diastolic murmur, no rubs, gallops, thrills, or heaves Abdomen: soft, nontender; no hepatosplenomehaly, BS+; abdominal aorta nontender and not  dilated by palpation. Back: no CVA tenderness Pulses 2+, possible soft femoral bruit Musculoskeletal: full range of motion, normal strength, no joint deformities Extremities: no clubbing cyanosis or edema, Homan's sign negative  Neurologic: grossly nonfocal; Cranial nerves grossly wnl Psychologic: Normal mood and affect   ECG (independently read by me): Normal sinus rhythm at 64 bpm.  No ectopy normal intervals  January 2019 ECG (independently read by me): Normal sinus rhythm at 78 bpm.  No ectopy.  Normal intervals.  October 2018 ECG (independently read by me): normal sinus rhythm at 78 bpm.  Normal intervals.  No ST segment changes.  April 2018 ECG (independently read by me): Normal sinus rhythm at 70 bpm.  No ectopy.  Normal intervals.  June 2017 ECG (independently read by me):  Normal sinus rhythm at 80 bpm..  Normal intervals.  No ST segment changes.  I have independently reviewed the November 10, 2015  ECG, which shows sinus bradycardia at 58 bpm with no ECG changes of a prior MI.  LABS:   BMP Latest Ref Rng & Units 12/07/2016 09/27/2015 09/26/2015  Glucose 65 - 99 mg/dL 87 145(H) 108(H)  BUN 7 - 25 mg/dL _0 Creatinine 0.50 - 0.99 mg/dL 0.84 1.02(H) 0.93  Sodium 135 - 146 mmol/L 142 143 142  Potassium 3.5 - 5.3 mmol/L 4.2 3.5 4.3  Chloride 98 - 110 mmol/L 105 107 105  CO2 20 - 31 mmol/L _1 Calcium 8.6 - 10.4 mg/dL 9.3 9.0 9.2     Hepatic Function Latest Ref Rng & Units 12/07/2016 09/27/2015 08/26/2015  Total Protein 6.1 - 8.1 g/dL 6.4 6.3(L) 6.5  Albumin 3.6 - 5.1 g/dL 3.9 3.4(L) 3.7  AST 10 - 35 U/L _2 ALT 6 - 29 U/L _3 Alk Phosphatase 33 - 130 U/L 72 88 85  Total Bilirubin 0.2 - 1.2 mg/dL 0.5 0.5 0.5  Bilirubin, Direct - - - -    CBC Latest Ref Rng & Units 12/07/2016 09/27/2015 09/26/2015  WBC 3.8 - 10.8 K/uL 7.3 8.6 8.9  Hemoglobin 11.7 - 15.5 g/dL 12.4 12.1 12.3  Hematocrit 35.0 - 45.0 % 38.6 36.1 36.7  Platelets 140 - 400 K/uL 310 259 256    Lab Results  Component Value Date   MCV 89.8 12/07/2016   MCV 90.7 09/27/2015   MCV 90.8 09/26/2015    Lab Results  Component Value Date   TSH 1.56 12/07/2016    BNP    Component Value Date/Time   BNP 51.1 08/26/2015 1927    ProBNP    Component Value Date/Time   PROBNP 87.6 03/14/2014 1410     Lipid Panel     Component Value Date/Time   CHOL 208 (H) 12/07/2016 1133   TRIG 120 12/07/2016 1133   HDL 56 12/07/2016 1133   CHOLHDL 3.7 12/07/2016 1133   VLDL 24 12/07/2016 1133   LDLCALC 128 (H) 12/07/2016 1133   Most recent lipid panel from 08/09/2017: Total cholesterol 141, HDL 54, LDL 65, triglycerides 112.  RADIOLOGY: No results found.  IMPRESSION:  1. CAD in native artery   2. Status post inferior ST elevation MI treated with DES stenting of a large dominant RCA.   3. Tobacco abuse   4. Leg cramping   5. Essential hypertension   6. Hyperlipidemia with target LDL less than 70  7. Class 1 obesity due to excess calories with serious comorbidity and body mass index (BMI) of 34.0 to 34.9 in adult     ASSESSMENT AND PLAN: Leslie Dorsey is a 68 year old female who suffered an inferior ST segment myocardial infarction on 08/26/2015 and underwent successful PCI with DES stenting to a large dominant RCA. She developed SOB which may have been secondary to Brilinta-induced adenosine changes which improved with change to Plavix. She had initially quit smoking for 5 months following her myocardial infarction, but resumed smoking. Her echo Doppler and nuclear stress test from April 2018 confirmed normal systolic function.  There was grade 1 diastolic dysfunction.  She had normal perfusion.  I long discussion with her regarding the importance of LDL lowering.  When I last saw her, I recommended a retrial of statin therapy since.  Reportedly, she could not tolerate this in the past.  When I last saw her she was tolerating rosuvastatin and suggested a trial of 20 mg.  Lipid  studies significantly improved.  She apparently is now on 10 mg daily.  Her blood pressure today is stable on amlodipine 2.5 mg, losartan 100 mg and HCTZ.  He is to be on dual antiplatelet therapy with aspirin and Plavix.  With her 55-year tobacco history, I discussed with her the association between peripheral vascular disease and coronary artery disease.  She admits to leg cramping with walking.  I have recommended she undergo a lower extremity arterial Doppler assessment to assess for claudication.  She is now on her reduced dose of rosuvastatin at 10 mg and LDL on June 18, 2019 had increased to 82.  I have recommended the addition of Zetia 10 mg to achieve a target LDL less than 70 and preferably in the 50s.  I discussed the importance of weight loss and increased activity.  I will see her in 6 months for reevaluation.   Time spent: 25 minutes Troy Sine, MD, [facility]  07/26/2019 4:47 PM

## 2019-07-24 NOTE — Patient Instructions (Signed)
Medication Instructions:  START ZETIA IN THE EVENING If you need a refill on your cardiac medications before your next appointment, please call your pharmacy.  Testing/Procedures: Your physician has requested that you have a lower extremity arterial exercise duplex. During this test, exercise and ultrasound are used to evaluate arterial blood flow in the legs. Allow one hour for this exam. There are no restrictions or special instructions.  Special Instructions: TRY TO STOP SMOKING  INCREASE EXERCISE 30 MIN A DAY 5 DAYS A WEEK  PLEASE TRY TO LOOSE WEIGHT  Follow-Up: IN 6 months Please call our office 2 months in advance, [facility] 2021 to schedule this MAY 2021 appointment. In Person Shelva Majestic, MD.    At [facility], you and your health needs are our priority.  As part of our continuing mission to provide you with exceptional heart care, we have created designated Provider Care Teams.  These Care Teams include your primary Cardiologist (physician) and Advanced Practice Providers (APPs -  Physician Assistants and Nurse Practitioners) who all work together to provide you with the care you need, when you need it.  Thank you for choosing [facility] at [facility]!!

## 2019-07-26 ENCOUNTER — Encounter: Payer: Self-pay | Admitting: Cardiovascular Disease

## 2019-08-06 ENCOUNTER — Other Ambulatory Visit: Payer: Self-pay | Admitting: Cardiovascular Disease

## 2019-08-25 ENCOUNTER — Other Ambulatory Visit: Payer: Self-pay | Admitting: Cardiovascular Disease

## 2019-09-13 ENCOUNTER — Other Ambulatory Visit (HOSPITAL_COMMUNITY): Payer: Self-pay | Admitting: Cardiovascular Disease

## 2019-09-13 DIAGNOSIS — I739 Peripheral vascular disease, unspecified: Secondary | ICD-10-CM

## 2019-09-24 ENCOUNTER — Inpatient Hospital Stay (HOSPITAL_COMMUNITY): Admission: RE | Admit: 2019-09-24 | Payer: 59 | Source: Ambulatory Visit

## 2019-10-05 ENCOUNTER — Other Ambulatory Visit: Payer: Self-pay | Admitting: Cardiovascular Disease

## 2019-11-07 ENCOUNTER — Other Ambulatory Visit: Payer: Self-pay | Admitting: Cardiovascular Disease

## 2019-11-12 ENCOUNTER — Other Ambulatory Visit: Payer: Self-pay | Admitting: Cardiovascular Disease

## 2020-01-08 ENCOUNTER — Telehealth (INDEPENDENT_AMBULATORY_CARE_PROVIDER_SITE_OTHER): Payer: 59 | Admitting: Physician Assistant

## 2020-01-08 ENCOUNTER — Telehealth: Payer: Self-pay

## 2020-01-08 ENCOUNTER — Encounter: Payer: Self-pay | Admitting: Physician Assistant

## 2020-01-08 DIAGNOSIS — Z720 Tobacco use: Secondary | ICD-10-CM | POA: Diagnosis not present

## 2020-01-08 DIAGNOSIS — I2510 Atherosclerotic heart disease of native coronary artery without angina pectoris: Secondary | ICD-10-CM

## 2020-01-08 DIAGNOSIS — I10 Essential (primary) hypertension: Secondary | ICD-10-CM

## 2020-01-08 DIAGNOSIS — E785 Hyperlipidemia, unspecified: Secondary | ICD-10-CM

## 2020-01-08 MED ORDER — ROSUVASTATIN CALCIUM 20 MG PO TABS: 10.0000 mg | ORAL_TABLET | Freq: Every day | ORAL | 3 refills | Status: AC

## 2020-01-08 MED ORDER — LOSARTAN POTASSIUM 100 MG PO TABS: 100.0000 mg | ORAL_TABLET | Freq: Every day | ORAL | 2 refills | Status: DC

## 2020-01-08 MED ORDER — EZETIMIBE 10 MG PO TABS: 10.0000 mg | ORAL_TABLET | Freq: Every day | ORAL | 3 refills | Status: DC

## 2020-01-08 MED ORDER — AMLODIPINE BESYLATE 2.5 MG PO TABS: 2.5000 mg | ORAL_TABLET | Freq: Every day | ORAL | 3 refills | Status: AC

## 2020-01-08 NOTE — Telephone Encounter (Signed)
  Patient Consent for Virtual Visit         Leslie Dorsey has provided verbal consent on 01/08/2020 for a virtual visit (video or telephone).   CONSENT FOR VIRTUAL VISIT FOR:  Leslie Dorsey  By participating in this virtual visit I agree to the following:  I hereby voluntarily request, consent and authorize [facility] and its employed or contracted physicians, physician assistants, nurse practitioners or other licensed health care professionals (the Practitioner), to provide me with telemedicine health care services (the "Services") as deemed necessary by the treating Practitioner. I acknowledge and consent to receive the Services by the Practitioner via telemedicine. I understand that the telemedicine visit will involve communicating with the Practitioner through live audiovisual communication technology and the disclosure of certain medical information by electronic transmission. I acknowledge that I have been given the opportunity to request an in-person assessment or other available alternative prior to the telemedicine visit and am voluntarily participating in the telemedicine visit.  I understand that I have the right to withhold or withdraw my consent to the use of telemedicine in the course of my care at any time, without affecting my right to future care or treatment, and that the Practitioner or I may terminate the telemedicine visit at any time. I understand that I have the right to inspect all information obtained and/or recorded in the course of the telemedicine visit and may receive copies of available information for a reasonable fee.  I understand that some of the potential risks of receiving the Services via telemedicine include:  Marland Kitchen Delay or interruption in medical evaluation due to technological equipment failure or disruption; . Information transmitted may not be sufficient (e.g. poor resolution of images) to allow for appropriate medical decision making by the  Practitioner; and/or  . In rare instances, security protocols could fail, causing a breach of personal health information.  Furthermore, I acknowledge that it is my responsibility to provide information about my medical history, conditions and care that is complete and accurate to the best of my ability. I acknowledge that Practitioner's advice, recommendations, and/or decision may be based on factors not within their control, such as incomplete or inaccurate data provided by me or distortions of diagnostic images or specimens that may result from electronic transmissions. I understand that the practice of medicine is not an exact science and that Practitioner makes no warranties or guarantees regarding treatment outcomes. I acknowledge that a copy of this consent can be made available to me via my patient portal [facility] MyChart), or I can request a printed copy by calling the office of [facility].    I understand that my insurance will be billed for this visit.   I have read or had this consent read to me. . I understand the contents of this consent, which adequately explains the benefits and risks of the Services being provided via telemedicine.  . I have been provided ample opportunity to ask questions regarding this consent and the Services and have had my questions answered to my satisfaction. . I give my informed consent for the services to be provided through the use of telemedicine in my medical care

## 2020-01-08 NOTE — Progress Notes (Signed)
Virtual Visit via Telephone Note   This visit type was conducted due to national recommendations for restrictions regarding the COVID-19 Pandemic (e.g. social distancing) in an effort to limit this patient's exposure and mitigate transmission in our community.  Due to her co-morbid illnesses, this patient is at least at moderate risk for complications without adequate follow up.  This format is felt to be most appropriate for this patient at this time.  The patient did not have access to video technology/had technical difficulties with video requiring transitioning to audio format only (telephone).  All issues noted in this document were discussed and addressed.  No physical exam could be performed with this format.  Please refer to the patient's chart for her  consent to telehealth for [facility].   The patient was identified using 2 identifiers.  Date:  01/08/2020   ID:  Leslie Dorsey, DOB 09-13-1950, MRN [id number]  Patient Location: Home Provider Location: Home  PCP:  London Pepper, MD  Cardiologist:  Shelva Majestic, MD  Electrophysiologist:  None   Evaluation Performed:  Follow-Up Visit  Chief Complaint:  followup  History of Present Illness:    Leslie Dorsey is a 69 y.o. female with past medical history of tobacco abuse, hypertension, hyperlipidemia, obesity, and CAD.  Patient had inferior STEMI in December 2016, cardiac catheterization at the time showed a 99% stenosis in large dominant RCA and that this was treated with 3.5 x 15 mm resolute DES, she also had 10 to 20% minimal residual in her LAD.  Since then, she went to the ED in January 2017 with some vague chest discomfort and was ruled out by enzymes.  She was felt to possibly have some pericarditis.  Troponin was negative at the time.  Echocardiogram showed mild LVH, normal EF without wall motion abnormality.  Brilinta was later switched to Plavix due to side effect of shortness of breath.  Myoview performed in  April 2018 was normal and showed EF 72%, and normal perfusion.  Echocardiogram obtained in April 2018 showed EF 65 to 70% with grade 1 DD.  Patient presents today for virtual visit.  She denies any recent chest discomfort.  She has chronic dyspnea secondary to tobacco abuse.  I urged her to stop smoking.  She denies any lower extremity edema, orthopnea or PND.   The patient does not have symptoms concerning for COVID-19 infection (fever, chills, cough, or new shortness of breath).    Past Medical History:  Diagnosis Date  . CAD (coronary artery disease)    a. Inf STEMI 08/2015 - inferior STEMI 08/2015 s/p DES to RCA.  . Essential hypertension   . Hyperglycemia    a. 08/2015: A1C 5.8.  Marland Kitchen Hyperlipidemia   . Obesity   . ST elevation (STEMI) myocardial infarction involving right coronary artery ([place])   . Tobacco abuse    Past Surgical History:  Procedure Laterality Date  . ABDOMINAL HYSTERECTOMY    . CARDIAC CATHETERIZATION N/A 08/26/2015   Procedure: Left Heart Cath and Coronary Angiography;  Surgeon: Troy Sine, MD;  Location: [place] [facility];  Service: Cardiovascular;  Laterality: N/A;  . CARDIAC CATHETERIZATION N/A 08/26/2015   Procedure: Coronary Stent Intervention;  Surgeon: Troy Sine, MD;  Location: [facility];  Service: Cardiovascular;  Laterality: N/A;  . HEMORROIDECTOMY       Current Meds  Medication Sig  . albuterol (PROVENTIL HFA;VENTOLIN HFA) 108 (90 Base) MCG/ACT inhaler Inhale 1-2 puffs into the lungs every  6 (six) hours as needed for wheezing or shortness of breath.  Marland Kitchen amLODipine (NORVASC) 2.5 MG tablet TAKE 1 TABLET BY MOUTH EVERY DAY  . aspirin EC 81 MG EC tablet Take 1 tablet (81 mg total) by mouth daily.  . Cholecalciferol (VITAMIN D3) 125 MCG (5000 UT) CAPS Take 5,000 Units by mouth every other day.  . clonazePAM (KLONOPIN) 1 MG tablet Take 1 mg by mouth daily as needed for anxiety.   . clopidogrel (PLAVIX) 75 MG tablet Take 1 tablet (75 mg  total) by mouth daily.  . Coenzyme Q10 (COQ-10 PO) Take 200 mg by mouth daily.  . cyclobenzaprine (FLEXERIL) 10 MG tablet Take 5 mg by mouth daily as needed for muscle spasms.   Marland Kitchen ezetimibe (ZETIA) 10 MG tablet Take 1 tablet (10 mg total) by mouth daily.  . hydrochlorothiazide (HYDRODIURIL) 25 MG tablet Take 25 mg by mouth every morning.  Marland Kitchen ibuprofen (ADVIL) 800 MG tablet Take 400 mg by mouth 2 (two) times daily as needed.  . loratadine (CLARITIN) 10 MG tablet Take 10 mg by mouth daily as needed for allergies.  Marland Kitchen losartan (COZAAR) 100 MG tablet Take 1 tablet (100 mg total) by mouth daily.  . nicotine (NICODERM CQ - DOSED IN MG/24 HOURS) 21 mg/24hr patch PLEASE SEE ATTACHED FOR DETAILED DIRECTIONS  . OMEGA-3 FATTY ACIDS PO Take 800 mg by mouth daily.  Marland Kitchen oxyCODONE-acetaminophen (PERCOCET) 10-325 MG tablet TAKE 1/4 TABLET FOUR TIMES A DAY AS NEEDED  . rosuvastatin (CRESTOR) 20 MG tablet Take 1 tablet (20 mg total) by mouth daily. (Patient taking differently: Take 10 mg by mouth daily. )     Allergies:   Brilinta [ticagrelor], Levofloxacin, Morphine and related, and Lipitor [atorvastatin]   Social History   Tobacco Use  . Smoking status: Former Smoker    Quit date: 08/26/2015    Years since quitting: 4.3  . Smokeless tobacco: Never Used  Substance Use Topics  . Alcohol use: No  . Drug use: No     Family Hx: The patient's family history includes Heart attack in her mother; Heart disease in her mother.  ROS:   Please see the history of present illness.     All other systems reviewed and are negative.   Prior CV studies:   The following studies were reviewed today:  Echo 12/23/2016 LV EF: 65% -  70%   -------------------------------------------------------------------  Indications:   R06.09 Dyspnea on Exertion.   -------------------------------------------------------------------  History:  PMH: Acquired from the patient and from the patient&'s  chart. PMH: CAD. STEMI.  Risk factors: Former tobacco use.  Hypertension. Dyslipidemia.   -------------------------------------------------------------------  Study Conclusions   - Left ventricle: The cavity size was normal. There was mild focal  basal hypertrophy of the septum. Systolic function was vigorous.  The estimated ejection fraction was in the range of 65% to 70%.  Wall motion was normal; there were no regional wall motion  abnormalities. Doppler parameters are consistent with abnormal  left ventricular relaxation (grade 1 diastolic dysfunction).    Myoview 12/14/2016  Nuclear stress EF: 72%.  The left ventricular ejection fraction is hyperdynamic (>65%).  There was no ST segment deviation noted during stress.  The study is normal.   Normal stress nuclear study with no ischemia or infarction; EF 72 with normal wall motion.    Labs/Other Tests and Data Reviewed:    EKG:  An ECG dated 07/24/2019 was personally reviewed today and demonstrated:  Normal sinus rhythm without significant ST-T wave  changes.  Recent Labs: No results found for requested labs within last 8760 hours.   Recent Lipid Panel Lab Results  Component Value Date/Time   CHOL 208 (H) 12/07/2016 11:33 AM   TRIG 120 12/07/2016 11:33 AM   HDL 56 12/07/2016 11:33 AM   CHOLHDL 3.7 12/07/2016 11:33 AM   LDLCALC 128 (H) 12/07/2016 11:33 AM    Wt Readings from Last 3 Encounters:  07/24/19 205 lb (93 kg)  09/16/17 195 lb (88.5 kg)  06/07/17 192 lb (87.1 kg)     Objective:    Vital Signs:  There were no vitals taken for this visit.   VITAL SIGNS:  reviewed  ASSESSMENT & PLAN:    1. CAD: Denies any recent chest pain.  Last Myoview was in 2018.  Continue aspirin, Zetia and Crestor  2. Hypertension: Continue on current therapy  3. Hyperlipidemia: On Zetia and Crestor.  4. Tobacco abuse: She continues to smoke to this day.  We discussed the importance of tobacco cessation however she did not seem to be very  enthusiastic about quitting.  She has chronic dyspnea on exertion due to years of smoking.  I suspect she has some degree of COPD as well  COVID-19 Education: The signs and symptoms of COVID-19 were discussed with the patient and how to seek care for testing (follow up with PCP or arrange E-visit).  The importance of social distancing was discussed today.  Time:   Today, I have spent 5 minutes with the patient with telehealth technology discussing the above problems.     Medication Adjustments/Labs and Tests Ordered: Current medicines are reviewed at length with the patient today.  Concerns regarding medicines are outlined above.   Tests Ordered: No orders of the defined types were placed in this encounter.   Medication Changes: No orders of the defined types were placed in this encounter.   Follow Up:  Either In Person or Virtual in 6 month(s)  Signed, Azalee Course, [place]  01/08/2020 10:55 AM    [facility]

## 2020-01-08 NOTE — Patient Instructions (Signed)
Medication Instructions:  Your physician recommends that you continue on your current medications as directed. Please refer to the Current Medication list given to you today.  *If you need a refill on your cardiac medications before your next appointment, please call your pharmacy*  Lab Work: NONE ordered at this time of appointment   If you have labs (blood work) drawn today and your tests are completely normal, you will receive your results only by: . MyChart Message (if you have MyChart) OR . A paper copy in the mail If you have any lab test that is abnormal or we need to change your treatment, we will call you to review the results.  Testing/Procedures: NONE ordered at this time of appointment   Follow-Up: At [facility], you and your health needs are our priority.  As part of our continuing mission to provide you with exceptional heart care, we have created designated Provider Care Teams.  These Care Teams include your primary Cardiologist (physician) and Advanced Practice Providers (APPs -  Physician Assistants and Nurse Practitioners) who all work together to provide you with the care you need, when you need it.  We recommend signing up for the patient portal called "MyChart".  Sign up information is provided on this After Visit Summary.  MyChart is used to connect with patients for Virtual Visits (Telemedicine).  Patients are able to view lab/test results, encounter notes, upcoming appointments, etc.  Non-urgent messages can be sent to your provider as well.   To learn more about what you can do with MyChart, go to [email or web address].    Your next appointment:   6 month(s)  The format for your next appointment:   In Person  Provider:   Thomas Kelly, MD  Other Instructions   

## 2020-01-08 NOTE — Telephone Encounter (Signed)
Called patient to discuss AVS instructions gave Hao Meng's recommendations and patient voiced understanding. AVS summary mailed to patient.    

## 2020-04-18 ENCOUNTER — Other Ambulatory Visit: Payer: Self-pay | Admitting: Cardiovascular Disease

## 2020-04-28 ENCOUNTER — Other Ambulatory Visit: Payer: Self-pay | Admitting: Cardiovascular Disease

## 2020-09-10 ENCOUNTER — Ambulatory Visit: Payer: 59 | Admitting: Cardiovascular Disease

## 2020-09-23 ENCOUNTER — Ambulatory Visit: Payer: Self-pay | Admitting: Physician Assistant

## 2020-09-25 ENCOUNTER — Encounter: Payer: Self-pay | Admitting: Physician Assistant

## 2020-09-27 NOTE — Progress Notes (Signed)
This encounter was created in error - please disregard.

## 2021-01-20 ENCOUNTER — Other Ambulatory Visit: Payer: Self-pay

## 2021-01-20 ENCOUNTER — Encounter: Payer: Self-pay | Admitting: Cardiovascular Disease

## 2021-01-20 ENCOUNTER — Ambulatory Visit (INDEPENDENT_AMBULATORY_CARE_PROVIDER_SITE_OTHER): Payer: Medicare Other | Admitting: Cardiovascular Disease

## 2021-01-20 DIAGNOSIS — I2510 Atherosclerotic heart disease of native coronary artery without angina pectoris: Secondary | ICD-10-CM

## 2021-01-20 DIAGNOSIS — I10 Essential (primary) hypertension: Secondary | ICD-10-CM

## 2021-01-20 DIAGNOSIS — Z720 Tobacco use: Secondary | ICD-10-CM

## 2021-01-20 DIAGNOSIS — E785 Hyperlipidemia, unspecified: Secondary | ICD-10-CM

## 2021-01-20 DIAGNOSIS — I252 Old myocardial infarction: Secondary | ICD-10-CM

## 2021-01-20 NOTE — Progress Notes (Signed)
Patient ID: Leslie Dorsey, female   DOB: 05/18/1951, 70 y.o.   MRN: [id number]    Primary MD: Dr. London Pepper  HPI: Leslie Dorsey is a 70 y.o. female who presents to the office today for an 67 month follow-upcardiology evaluation.  Leslie Dorsey has a long-standing history of tobacco abuse and started smoking at age 30 when living in [place].  She has a history of hypertension, hyperlipidemia, as well as obesity.  In December she began to  episodes of chest and back discomfort.  On December 2016 she developed severe chest pain and was found to have 3 mm of inferior ST segment elevation consistent with an inferior STEMI.  I performed emergent cardiac catheterization which revealed a 99% stenosis in a large dominant RCA.  She had minimal narrowing of 10 and 20% in her LAD.  She underwent successful PCI to her RCA and a 3.5 15 mm Resolute DES stent was inserted, postdilated 3.7 mm with reduction in the 99% stenosis to 0% and return of brisk TIMI-3 flow.   She [facility]2015 with some vague chest pain and ruled out.  She was felt possibly to have pericarditis.  Troponins were negative.  A 2-D echo Doppler study showed mild LVH with normal systolic function without regional wall motion abnormality.  He was felt that her pain was musculoskeletal.  She was hypertensive and losartan was increased and amlodipine was added.  She was seen by Margarite Gouge on 03/06/2017and was having some vague dyspnea.  At that time her Brilinta was discontinued and she was changed to Plavix in the event that this may have been Brilinta mediated. .  When I  saw her, she had started back smoking again after having quit for 5 months.  She has been smoking for 53 years.  She has been under increased stress in that her son is in prison and her mother has significant dementia and has had several falls. She has noticed exertional shortness of breath as well as occasional left leg cramps.  She continues to drink  at least 4-5 cups of coffee per day.  She is unaware of recent palpitations.    A nuclear stress test in April 2018 was normal and showed an EF of 72% with normal perfusion and no ECG changes.  An echo Doppler study on April 19,2018 showed an EF of 65-70% with grade 1 diastolic dysfunction. .  She continues to smoke but reducedfrom 2 packs per day to one half pack per day.  In the past was reported statin intolerance.  When I last saw her, I recommended a retrial of rosuvastatin.  She actually has been able to tolerate this and is now taking 20 mg daily.  Her LDL was significantly reduced now at 65 from laboratory in December 2018 down from 146.  I saw her in January 2019 at which time she was having difficulty with her hip which has minimized her walking.  At times she noted some mild lightheadedness. She denied edema.  She was determined to quit smoking and had a smoking cessation date 11/23/2017.    I last saw her in November 2020.  At that time she was still smoking.  She started smoking at age 23 and had reduced her  her tobacco from 2 packs/day to approximately 3 cigarettes/day.  She was on amlodipine 2.5 mg, losartan 100 mg and HCTZ 25 mg for hypertension.  She continues to be on aspirin and Plavix.  She has  been taking rosuvastatin 10 mg and has been using a nicotine patch.  She was experiencing leg cramps with walking.  She has never had lower extremity Doppler studies.  She has not been very active.  She hasd not been successful with weight loss.    Since I last saw her, she was evaluated in a telemedicine visit in May 2021 by Almyra Deforest, [facility].  She was continuing to smoke.  Presently, she states that her husband died in 16-Aug-2020 at age 52.  She has been smoking half pack per day.  She denies any recurrent chest pain.  She sees Dr. London Pepper at [place] who will checking labs.  She continues to be on aspirin and Plavix for DAPT.  She is on rosuvastatin 20 mg and Zetia 10 mg for hyperlipidemia.   She has continued to be on HCTZ 25 mg, amlodipine 2.5 mg, and losartan 100 mg daily for hypertension.  Laboratory in December 2021 showed an LDL cholesterol at 48.  She presents for evaluation.   Past Medical History:  Diagnosis Date  . CAD (coronary artery disease)    a. Inf STEMI 08/2015 - inferior STEMI 08/2015 s/p DES to RCA.  . Essential hypertension   . Hyperglycemia    a. 08/2015: A1C 5.8.  Marland Kitchen Hyperlipidemia   . Obesity   . ST elevation (STEMI) myocardial infarction involving right coronary artery ([place])   . Tobacco abuse     Past Surgical History:  Procedure Laterality Date  . ABDOMINAL HYSTERECTOMY    . CARDIAC CATHETERIZATION N/A 08/26/2015   Procedure: Left Heart Cath and Coronary Angiography;  Surgeon: Troy Sine, MD;  Location: [place] [facility];  Service: Cardiovascular;  Laterality: N/A;  . CARDIAC CATHETERIZATION N/A 08/26/2015   Procedure: Coronary Stent Intervention;  Surgeon: Troy Sine, MD;  Location: [place] [facility];  Service: Cardiovascular;  Laterality: N/A;  . HEMORROIDECTOMY      Allergies  Allergen Reactions  . Brilinta [Ticagrelor] Shortness Of Breath    Difficulty breathing   . Levofloxacin Anaphylaxis, Hives and Rash  . Morphine And Related Anaphylaxis  . Lipitor [Atorvastatin]     Myalgia     Current Outpatient Medications  Medication Sig Dispense Refill  . albuterol (PROVENTIL HFA;VENTOLIN HFA) 108 (90 Base) MCG/ACT inhaler Inhale 1-2 puffs into the lungs every 6 (six) hours as needed for wheezing or shortness of breath.    Marland Kitchen amLODipine (NORVASC) 2.5 MG tablet Take 1 tablet (2.5 mg total) by mouth daily. 90 tablet 3  . aspirin EC 81 MG EC tablet Take 1 tablet (81 mg total) by mouth daily.    . Cholecalciferol (VITAMIN D3) 125 MCG (5000 UT) CAPS Take 5,000 Units by mouth every other day.    . clonazePAM (KLONOPIN) 1 MG tablet Take 1 mg by mouth daily as needed for anxiety.   1  . clopidogrel (PLAVIX) 75 MG tablet TAKE 1 TABLET BY  MOUTH EVERY DAY 90 tablet 3  . Coenzyme Q10 (COQ-10 PO) Take 200 mg by mouth daily.    . cyclobenzaprine (FLEXERIL) 10 MG tablet Take 5 mg by mouth daily as needed for muscle spasms.   0  . hydrochlorothiazide (HYDRODIURIL) 25 MG tablet Take 25 mg by mouth every morning.    Marland Kitchen ibuprofen (ADVIL) 800 MG tablet Take 400 mg by mouth 2 (two) times daily as needed.    . loratadine (CLARITIN) 10 MG tablet Take 10 mg by mouth daily as needed for allergies.    Marland Kitchen  losartan (COZAAR) 100 MG tablet Take 1 tablet (100 mg total) by mouth daily. 90 tablet 2  . nicotine (NICODERM CQ - DOSED IN MG/24 HOURS) 21 mg/24hr patch PLEASE SEE ATTACHED FOR DETAILED DIRECTIONS 28 patch 3  . nitroGLYCERIN (NITROSTAT) 0.4 MG SL tablet PLACE 1 TABLET UNDER TONGUE EVERY 5 MINUTES FOR 3 DOSES AS NEEDED FOR CHEST PAIN 25 tablet 1  . OMEGA-3 FATTY ACIDS PO Take 800 mg by mouth daily.    Marland Kitchen oxyCODONE-acetaminophen (PERCOCET) 10-325 MG tablet TAKE 1/4 TABLET FOUR TIMES A DAY AS NEEDED    . rosuvastatin (CRESTOR) 20 MG tablet Take 0.5 tablets (10 mg total) by mouth daily. 45 tablet 3  . ezetimibe (ZETIA) 10 MG tablet Take 1 tablet (10 mg total) by mouth daily. 90 tablet 3   No current facility-administered medications for this visit.    Social History   Socioeconomic History  . Marital status: Married    Spouse name: Not on file  . Number of children: Not on file  . Years of education: Not on file  . Highest education level: Not on file  Occupational History  . Not on file  Tobacco Use  . Smoking status: Former Smoker    Quit date: 08/26/2015    Years since quitting: 5.4  . Smokeless tobacco: Never Used  Substance and Sexual Activity  . Alcohol use: No  . Drug use: No  . Sexual activity: Not on file  Other Topics Concern  . Not on file  Social History Narrative  . Not on file   Social Determinants of Health   Financial Resource Strain: Not on file  Food Insecurity: Not on file  Transportation Needs: Not on file   Physical Activity: Not on file  Stress: Not on file  Social Connections: Not on file  Intimate Partner Violence: Not on file    Family History  Problem Relation Age of Onset  . Heart disease Mother   . Heart attack Mother     ROS General: Negative; No fevers, chills, or night sweats HEENT: Negative; No changes in vision or hearing, sinus congestion, difficulty swallowing Pulmonary: Negative; No cough, wheezing, shortness of breath, hemoptysis Cardiovascular: See HPI:  GI: Negative; No nausea, vomiting, diarrhea, or abdominal pain GU: Negative; No dysuria, hematuria, or difficulty voiding Musculoskeletal: Hip discomfort Hematologic: Negative; no easy bruising, bleeding Endocrine: Negative; no heat/cold intolerance; no diabetes, Neuro: Negative; no changes in balance, headaches Skin: Negative; No rashes or skin lesions Psychiatric: Negative; No behavioral problems, depression Sleep: Negative; No snoring,  daytime sleepiness, hypersomnolence, bruxism, restless legs, hypnogognic hallucinations. Other comprehensive 14 point system review is negative   Physical Exam BP 139/78   Pulse 80   Ht _0  (1.651 m)   Wt 180 lb 12.8 oz (82 kg)   SpO2 96%   BMI 30.09 kg/m    Repeat blood pressure by me was 134/76  Wt Readings from Last 3 Encounters:  01/20/21 180 lb 12.8 oz (82 kg)  07/24/19 205 lb (93 kg)  09/16/17 195 lb (88.5 kg)   General: Alert, oriented, no distress.  Skin: normal turgor, no rashes, warm and dry HEENT: Normocephalic, atraumatic. Pupils equal round and reactive to light; sclera anicteric; extraocular muscles intact; Nose without nasal septal hypertrophy Mouth/Parynx benign; Mallinpatti scale 3 Neck: No JVD, no carotid bruits; normal carotid upstroke Lungs: clear to ausculatation and percussion; no wheezing or rales Chest wall: without tenderness to palpitation Heart: PMI not displaced, RRR, s1 s2 normal, 1/6 systolic  murmur, no diastolic murmur, no rubs,  gallops, thrills, or heaves Abdomen: soft, nontender; no hepatosplenomehaly, BS+; abdominal aorta nontender and not dilated by palpation. Back: no CVA tenderness Pulses 2+ Musculoskeletal: full range of motion, normal strength, no joint deformities Extremities: no clubbing cyanosis or edema, Homan's sign negative  Neurologic: grossly nonfocal; Cranial nerves grossly wnl Psychologic: Normal mood and affect   ECG (independently read by me): NSR at 80; no ectopy, normal intervals  November 2020 ECG (independently read by me): Normal sinus rhythm at 64 bpm.  No ectopy normal intervals  January 2019 ECG (independently read by me): Normal sinus rhythm at 78 bpm.  No ectopy.  Normal intervals.  October 2018 ECG (independently read by me): normal sinus rhythm at 78 bpm.  Normal intervals.  No ST segment changes.  April 2018 ECG (independently read by me): Normal sinus rhythm at 70 bpm.  No ectopy.  Normal intervals.  June 2017 ECG (independently read by me):  Normal sinus rhythm at 80 bpm..  Normal intervals.  No ST segment changes.  I have independently reviewed the November 10, 2015  ECG, which shows sinus bradycardia at 58 bpm with no ECG changes of a prior MI.  LABS:   BMP Latest Ref Rng & Units 12/07/2016 09/27/2015 09/26/2015  Glucose 65 - 99 mg/dL 87 145(H) 108(H)  BUN 7 - 25 mg/dL _0 Creatinine 0.50 - 0.99 mg/dL 0.84 1.02(H) 0.93  Sodium 135 - 146 mmol/L 142 143 142  Potassium 3.5 - 5.3 mmol/L 4.2 3.5 4.3  Chloride 98 - 110 mmol/L 105 107 105  CO2 20 - 31 mmol/L _1 Calcium 8.6 - 10.4 mg/dL 9.3 9.0 9.2     Hepatic Function Latest Ref Rng & Units 12/07/2016 09/27/2015 08/26/2015  Total Protein 6.1 - 8.1 g/dL 6.4 6.3(L) 6.5  Albumin 3.6 - 5.1 g/dL 3.9 3.4(L) 3.7  AST 10 - 35 U/L _2 ALT 6 - 29 U/L _3 Alk Phosphatase 33 - 130 U/L 72 88 85  Total Bilirubin 0.2 - 1.2 mg/dL 0.5 0.5 0.5  Bilirubin, Direct - - - -    CBC Latest Ref Rng & Units 12/07/2016  09/27/2015 09/26/2015  WBC 3.8 - 10.8 K/uL 7.3 8.6 8.9  Hemoglobin 11.7 - 15.5 g/dL 12.4 12.1 12.3  Hematocrit 35.0 - 45.0 % 38.6 36.1 36.7  Platelets 140 - 400 K/uL 310 259 256   Lab Results  Component Value Date   MCV 89.8 12/07/2016   MCV 90.7 09/27/2015   MCV 90.8 09/26/2015    Lab Results  Component Value Date   TSH 1.56 12/07/2016    BNP    Component Value Date/Time   BNP 51.1 08/26/2015 1927    ProBNP    Component Value Date/Time   PROBNP 87.6 03/14/2014 1410     Lipid Panel     Component Value Date/Time   CHOL 208 (H) 12/07/2016 1133   TRIG 120 12/07/2016 1133   HDL 56 12/07/2016 1133   CHOLHDL 3.7 12/07/2016 1133   VLDL 24 12/07/2016 1133   LDLCALC 128 (H) 12/07/2016 1133   Most recent lipid panel from 08/09/2017: Total cholesterol 141, HDL 54, LDL 65, triglycerides 112.  RADIOLOGY: No results found.  IMPRESSION:  1. Essential hypertension   2. Coronary artery disease involving native coronary artery of native heart without angina pectoris   3. Status post inferior ST elevation MI treated with DES stenting of a large dominant  RCA.   4. Hyperlipidemia with target LDL less than 70   5. Tobacco abuse     ASSESSMENT AND PLAN: Leslie Dorsey is a 70 year old female who suffered an inferior ST segment myocardial infarction on 08/26/2015 and underwent successful PCI with DES stenting to a large dominant RCA. She developed SOB which may have been secondary to Brilinta-induced adenosine changes which improved with change to Plavix. She had initially quit smoking for 5 months following her myocardial infarction, but resumed smoking. Her echo Doppler and nuclear stress test from April 2018 confirmed normal systolic function.  There was grade 1 diastolic dysfunction.  She had normal perfusion.  Unfortunately, her husband passed away at age 65 in August 20, 2020.  She continues to smoke and has been smoking for for 57 years.  In the past she had some issues with  statin intolerance but seems to currently tolerate rosuvastatin 20 mg in addition to Zetia 10 mg.  Most recent lipid studies from December 2021 showed total cholesterol 122, LDL cholesterol 48 and triglycerides 72.  HDL was 59.  In the past, she had experienced some lower extremity cramping.  She denies significant claudication symptoms.  Remotely I had suggested a lower extremity Doppler study but apparently this had not been done.  Blood pressure today is relatively well controlled on current therapy.  She has lost approximately 25 pounds since my last evaluation in 08/21/2019.  I commended her on her weight loss.  I again discussed the importance of smoking cessation.  She will be following up with Dr. London Pepper at [facility] and repeat laboratory will be drawn.  I will see her in 1 year for reevaluation   Troy Sine, MD, [facility]  01/22/2021 2:29 PM

## 2021-01-20 NOTE — Patient Instructions (Signed)

## 2021-01-22 ENCOUNTER — Encounter: Payer: Self-pay | Admitting: Cardiovascular Disease

## 2021-01-24 ENCOUNTER — Other Ambulatory Visit: Payer: Self-pay | Admitting: Physician Assistant

## 2021-03-25 DIAGNOSIS — M47816 Spondylosis without myelopathy or radiculopathy, lumbar region: Secondary | ICD-10-CM | POA: Diagnosis not present

## 2021-03-25 DIAGNOSIS — Z79891 Long term (current) use of opiate analgesic: Secondary | ICD-10-CM | POA: Diagnosis not present

## 2021-03-25 DIAGNOSIS — M5416 Radiculopathy, lumbar region: Secondary | ICD-10-CM | POA: Diagnosis not present

## 2021-03-25 DIAGNOSIS — G894 Chronic pain syndrome: Secondary | ICD-10-CM | POA: Diagnosis not present

## 2021-04-23 DIAGNOSIS — G894 Chronic pain syndrome: Secondary | ICD-10-CM | POA: Diagnosis not present

## 2021-04-23 DIAGNOSIS — M5416 Radiculopathy, lumbar region: Secondary | ICD-10-CM | POA: Diagnosis not present

## 2021-04-23 DIAGNOSIS — M47816 Spondylosis without myelopathy or radiculopathy, lumbar region: Secondary | ICD-10-CM | POA: Diagnosis not present

## 2021-05-13 DIAGNOSIS — R7309 Other abnormal glucose: Secondary | ICD-10-CM | POA: Diagnosis not present

## 2021-05-13 DIAGNOSIS — E785 Hyperlipidemia, unspecified: Secondary | ICD-10-CM | POA: Diagnosis not present

## 2021-05-13 DIAGNOSIS — I2510 Atherosclerotic heart disease of native coronary artery without angina pectoris: Secondary | ICD-10-CM | POA: Diagnosis not present

## 2021-05-13 DIAGNOSIS — I10 Essential (primary) hypertension: Secondary | ICD-10-CM | POA: Diagnosis not present

## 2021-05-21 DIAGNOSIS — M5416 Radiculopathy, lumbar region: Secondary | ICD-10-CM | POA: Diagnosis not present

## 2021-05-21 DIAGNOSIS — M47816 Spondylosis without myelopathy or radiculopathy, lumbar region: Secondary | ICD-10-CM | POA: Diagnosis not present

## 2021-05-21 DIAGNOSIS — G894 Chronic pain syndrome: Secondary | ICD-10-CM | POA: Diagnosis not present

## 2021-06-05 DIAGNOSIS — R059 Cough, unspecified: Secondary | ICD-10-CM | POA: Diagnosis not present

## 2021-06-05 DIAGNOSIS — J988 Other specified respiratory disorders: Secondary | ICD-10-CM | POA: Diagnosis not present

## 2021-07-07 DIAGNOSIS — E785 Hyperlipidemia, unspecified: Secondary | ICD-10-CM | POA: Diagnosis not present

## 2021-07-07 DIAGNOSIS — Z720 Tobacco use: Secondary | ICD-10-CM | POA: Diagnosis not present

## 2021-07-07 DIAGNOSIS — I2510 Atherosclerotic heart disease of native coronary artery without angina pectoris: Secondary | ICD-10-CM | POA: Diagnosis not present

## 2021-07-07 DIAGNOSIS — I10 Essential (primary) hypertension: Secondary | ICD-10-CM | POA: Diagnosis not present

## 2021-07-07 DIAGNOSIS — R7309 Other abnormal glucose: Secondary | ICD-10-CM | POA: Diagnosis not present

## 2021-07-07 DIAGNOSIS — Z0000 Encounter for general adult medical examination without abnormal findings: Secondary | ICD-10-CM | POA: Diagnosis not present

## 2021-07-16 DIAGNOSIS — M5416 Radiculopathy, lumbar region: Secondary | ICD-10-CM | POA: Diagnosis not present

## 2021-07-16 DIAGNOSIS — M47816 Spondylosis without myelopathy or radiculopathy, lumbar region: Secondary | ICD-10-CM | POA: Diagnosis not present

## 2021-07-16 DIAGNOSIS — G894 Chronic pain syndrome: Secondary | ICD-10-CM | POA: Diagnosis not present

## 2021-07-17 ENCOUNTER — Emergency Department (HOSPITAL_COMMUNITY)
Admission: EM | Admit: 2021-07-17 | Discharge: 2021-07-17 | Disposition: A | Discharge status: Home / Self Care | Payer: Medicare Other | Attending: Emergency Medicine | Admitting: Emergency Medicine

## 2021-07-17 ENCOUNTER — Encounter (HOSPITAL_COMMUNITY): Payer: Self-pay | Admitting: Emergency Medicine

## 2021-07-17 ENCOUNTER — Emergency Department (HOSPITAL_COMMUNITY): Payer: Medicare Other

## 2021-07-17 ENCOUNTER — Emergency Department (HOSPITAL_COMMUNITY)
Admission: EM | Admit: 2021-07-17 | Discharge: 2021-07-17 | Disposition: A | Discharge status: Home / Self Care | Payer: Medicare Other | Source: Home / Self Care

## 2021-07-17 ENCOUNTER — Other Ambulatory Visit: Payer: Self-pay

## 2021-07-17 DIAGNOSIS — M25512 Pain in left shoulder: Secondary | ICD-10-CM | POA: Insufficient documentation

## 2021-07-17 DIAGNOSIS — R0789 Other chest pain: Secondary | ICD-10-CM | POA: Insufficient documentation

## 2021-07-17 DIAGNOSIS — Z5321 Procedure and treatment not carried out due to patient leaving prior to being seen by health care provider: Secondary | ICD-10-CM | POA: Insufficient documentation

## 2021-07-17 DIAGNOSIS — Z743 Need for continuous supervision: Secondary | ICD-10-CM | POA: Diagnosis not present

## 2021-07-17 DIAGNOSIS — R6889 Other general symptoms and signs: Secondary | ICD-10-CM | POA: Diagnosis not present

## 2021-07-17 DIAGNOSIS — R079 Chest pain, unspecified: Secondary | ICD-10-CM | POA: Diagnosis not present

## 2021-07-17 DIAGNOSIS — M545 Low back pain, unspecified: Secondary | ICD-10-CM | POA: Insufficient documentation

## 2021-07-17 LAB — CBC WITH DIFFERENTIAL/PLATELET
Abs Immature Granulocytes: 0.01 10*3/uL (ref 0.00–0.07)
Abs Immature Granulocytes: 0.02 10*3/uL (ref 0.00–0.07)
Basophils Absolute: 0.1 10*3/uL (ref 0.0–0.1)
Basophils Absolute: 0.1 10*3/uL (ref 0.0–0.1)
Basophils Relative: 1 %
Basophils Relative: 1 %
Eosinophils Absolute: 0.1 10*3/uL (ref 0.0–0.5)
Eosinophils Absolute: 0.1 10*3/uL (ref 0.0–0.5)
Eosinophils Relative: 1 %
Eosinophils Relative: 1 %
HCT: 41.3 % (ref 36.0–46.0)
HCT: 41.2 % (ref 36.0–46.0)
Hemoglobin: 13.8 g/dL (ref 12.0–15.0)
Hemoglobin: 13.5 g/dL (ref 12.0–15.0)
Immature Granulocytes: 0 %
Immature Granulocytes: 0 %
Lymphocytes Relative: 31 %
Lymphocytes Relative: 30 %
Lymphs Abs: 2.8 10*3/uL (ref 0.7–4.0)
Lymphs Abs: 2.2 10*3/uL (ref 0.7–4.0)
MCH: 30.6 pg (ref 26.0–34.0)
MCH: 30.5 pg (ref 26.0–34.0)
MCHC: 33.4 g/dL (ref 30.0–36.0)
MCHC: 32.8 g/dL (ref 30.0–36.0)
MCV: 91.6 fL (ref 80.0–100.0)
MCV: 93 fL (ref 80.0–100.0)
Monocytes Absolute: 0.4 10*3/uL (ref 0.1–1.0)
Monocytes Absolute: 0.4 10*3/uL (ref 0.1–1.0)
Monocytes Relative: 5 %
Monocytes Relative: 5 %
Neutro Abs: 5.5 10*3/uL (ref 1.7–7.7)
Neutro Abs: 4.7 10*3/uL (ref 1.7–7.7)
Neutrophils Relative %: 62 %
Neutrophils Relative %: 63 %
Platelets: 276 10*3/uL (ref 150–400)
Platelets: 298 10*3/uL (ref 150–400)
RBC: 4.51 MIL/uL (ref 3.87–5.11)
RBC: 4.43 MIL/uL (ref 3.87–5.11)
RDW: 12.4 % (ref 11.5–15.5)
RDW: 12.4 % (ref 11.5–15.5)
WBC: 9 10*3/uL (ref 4.0–10.5)
WBC: 7.5 10*3/uL (ref 4.0–10.5)
nRBC: 0 % (ref 0.0–0.2)
nRBC: 0 % (ref 0.0–0.2)

## 2021-07-17 LAB — BASIC METABOLIC PANEL
Anion gap: 10 (ref 5–15)
BUN: 14 mg/dL (ref 8–23)
CO2: 27 mmol/L (ref 22–32)
Calcium: 9.1 mg/dL (ref 8.9–10.3)
Chloride: 101 mmol/L (ref 98–111)
Creatinine, Ser: 0.82 mg/dL (ref 0.44–1.00)
GFR, Estimated: >60 mL/min (ref >60)
Glucose, Bld: 103 mg/dL — ABNORMAL HIGH (ref 70–99)
Potassium: 3.3 mmol/L — ABNORMAL LOW (ref 3.5–5.1)
Sodium: 138 mmol/L (ref 135–145)

## 2021-07-17 LAB — TROPONIN I (HIGH SENSITIVITY)
Troponin I (High Sensitivity): 4 ng/L (ref <18)
Troponin I (High Sensitivity): 4 ng/L (ref <18)

## 2021-07-17 LAB — COMPREHENSIVE METABOLIC PANEL
ALT: 12 U/L (ref 0–44)
AST: 16 U/L (ref 15–41)
Albumin: 3.7 g/dL (ref 3.5–5.0)
Alkaline Phosphatase: 79 U/L (ref 38–126)
Anion gap: 8 (ref 5–15)
BUN: 15 mg/dL (ref 8–23)
CO2: 29 mmol/L (ref 22–32)
Calcium: 9.2 mg/dL (ref 8.9–10.3)
Chloride: 104 mmol/L (ref 98–111)
Creatinine, Ser: 0.85 mg/dL (ref 0.44–1.00)
GFR, Estimated: >60 mL/min (ref >60)
Glucose, Bld: 124 mg/dL — ABNORMAL HIGH (ref 70–99)
Potassium: 3.3 mmol/L — ABNORMAL LOW (ref 3.5–5.1)
Sodium: 141 mmol/L (ref 135–145)
Total Bilirubin: 0.3 mg/dL (ref 0.3–1.2)
Total Protein: 6.3 g/dL — ABNORMAL LOW (ref 6.5–8.1)

## 2021-07-17 NOTE — ED Notes (Signed)
Patient states she is leaving d/t wait time. EMS IV taken out by this NT

## 2021-07-17 NOTE — ED Triage Notes (Signed)
Patient here with two day history of chest pain.  Patient states that the pain increased in the last night.  Pain does radiate to neck and goes away.  Patient states that she has had some stress of late, husband passed away this year.  Patient does smoke.  Patient states that it hits on the right side of chest and radiates to center.  No nausea, no vomiting.

## 2021-07-17 NOTE — ED Provider Notes (Signed)
Emergency Medicine Provider Triage Evaluation Note  Leslie Dorsey , a 70 y.o. female  was evaluated in triage.  Pt complains of gradual onset, constant, achy, left-sided chest pain that began earlier today.  She reports that yesterday she was having pain underneath her right breast that felt stabbing in nature.  She came to the ED and had a work-up done however left prior to being seen.  Troponin 4 at that time.  She states that she went home and went to bed and felt fine.  This morning she woke up and did not have any pain.  She was laying down earlier today and started feeling heart palpitations.  She states similar symptoms when she had an MI in 2019 or 2020, she cannot recall the date.  She then began experiencing chest pain.  She denies any shortness of breath, nausea, vomiting.  She states that she called 911.  She was given 2 nitroglycerin and 324 mg aspirin with some relief.  Review of Systems  Positive: + chest pain, heart palpitations Negative: - SOB, nausea, vomiting  Physical Exam  BP (!) 144/73 (BP Location: Right Arm)   Pulse 89   Temp 98.7 F (37.1 C) (Oral)   Resp 20   SpO2 98%  Gen:   Awake, no distress   Resp:  Normal effort  MSK:   Moves extremities without difficulty  Other:  RRR  Medical Decision Making  Medically screening exam initiated at 5:50 PM.  Appropriate orders placed.  Larkin Ina was informed that the remainder of the evaluation will be completed by another provider, this initial triage assessment does not replace that evaluation, and the importance of remaining in the ED until their evaluation is complete.     Tanda Rockers, PA-C 07/17/21 1751    Ernie Avena, MD 07/17/21 [phone]

## 2021-07-17 NOTE — ED Triage Notes (Signed)
Pt BIB [facility] from home, c/o left sided chest pain x 2 days. Reports pain radiating to her back and between her shoulder blades while with EMS today. Given 2NTG with some relief and 324mg  asa.

## 2021-07-17 NOTE — ED Notes (Signed)
Patient has been called  recheck vitalsigns no answer x5

## 2021-07-17 NOTE — ED Provider Notes (Signed)
Emergency Medicine Provider Triage Evaluation Note  Yancy Hascall , a 70 y.o. female  was evaluated in triage.  Pt complains of chest pain.  Ongoing for 2 days, mild at first but now more severe.   Occasionally shoots to left neck, no arm pain. Lots of stress this year as she lost her husband.  Reports hx of MI.  On ASA and plavix.  Follows with cardiology, Dr. Tresa Endo.  Review of Systems  Positive: Chest pain Negative: Fever, cough  Physical Exam  BP (!) 172/87   Pulse 72   Temp 98.1 F (36.7 C) (Oral)   Resp 18   SpO2 96%   Gen:   Awake, no distress   Resp:  Normal effort  MSK:   Moves extremities without difficulty  Other:    Medical Decision Making  Medically screening exam initiated at 1:13 AM.  Appropriate orders placed.  Larkin Ina was informed that the remainder of the evaluation will be completed by another provider, this initial triage assessment does not replace that evaluation, and the importance of remaining in the ED until their evaluation is complete.  Chest pain, worsening over 2 days.  Reports hx of MI.  EKG, labs, CXR.   Garlon Hatchet, PA-C 07/17/21 0118    Marily Memos, MD 07/18/21 [phone]

## 2021-07-20 ENCOUNTER — Telehealth: Payer: Self-pay | Admitting: Cardiovascular Disease

## 2021-07-20 NOTE — Telephone Encounter (Signed)
Direct transfer to triage  Per Pt she has had ongoing chest discomfort.  It started on the right side as a pinching sensation.  Now it has moved to the left side and up to her jaw.  Is not painful right now, more like a "pulsing sensation".  She also thinks the losartan is causing her to have increased mucus in her lungs and would like this medication changed.  She went to the ER twice on 07/17/2021 and left without being seen both times because of the length of wait.  Pt is asking to be seen sometime this week.  Discussed with Dr. Landry Dyke nurse.  Agree appropriate to put on DOD schedule for Wednesday.

## 2021-07-20 NOTE — Telephone Encounter (Signed)
Pt c/o of Chest Pain: STAT if CP now or developed within 24 hours  1. Are you having CP right now? yes  2. Are you experiencing any other symptoms (ex. SOB, nausea, vomiting, sweating)?   3. How long have you been experiencing CP? Since 07/17/21  4. Is your CP continuous or coming and going? Continuous   5. Have you taken Nitroglycerin? Not since Saturday  Patient wanted to talk to Dr. Tresa Endo. She went to the ED and was not treated  ?   Pt c/o medication issue:  1. Name of Medication: losartan (COZAAR) 100 MG tablet  2. How are you currently taking this medication (dosage and times per day)? Patient has not taken this medication since 07/17/21  3. Are you having a reaction (difficulty breathing--STAT)?   4. What is your medication issue? Patient said she had a significant amount of mucus and every time she laid down she would practically choke on her mucus. Since she has stopped taking it she has gotten relief

## 2021-07-22 ENCOUNTER — Ambulatory Visit: Payer: Medicare Other | Admitting: Cardiovascular Disease

## 2021-07-22 ENCOUNTER — Other Ambulatory Visit: Payer: Self-pay

## 2021-07-22 ENCOUNTER — Encounter: Payer: Self-pay | Admitting: Cardiovascular Disease

## 2021-07-22 VITALS — BP 135/74 | HR 70 | Ht 65.0 in | Wt 174.0 lb

## 2021-07-22 DIAGNOSIS — I10 Essential (primary) hypertension: Secondary | ICD-10-CM

## 2021-07-22 DIAGNOSIS — I2510 Atherosclerotic heart disease of native coronary artery without angina pectoris: Secondary | ICD-10-CM | POA: Diagnosis not present

## 2021-07-22 DIAGNOSIS — Z716 Tobacco abuse counseling: Secondary | ICD-10-CM | POA: Diagnosis not present

## 2021-07-22 DIAGNOSIS — R072 Precordial pain: Secondary | ICD-10-CM | POA: Diagnosis not present

## 2021-07-22 DIAGNOSIS — Z720 Tobacco use: Secondary | ICD-10-CM

## 2021-07-22 DIAGNOSIS — E876 Hypokalemia: Secondary | ICD-10-CM

## 2021-07-22 MED ORDER — POTASSIUM CHLORIDE CRYS ER 20 MEQ PO TBCR: 20.0000 meq | EXTENDED_RELEASE_TABLET | Freq: Every day | ORAL | 3 refills | Status: DC

## 2021-07-22 MED ORDER — IBUPROFEN 800 MG PO TABS: 800.0000 mg | ORAL_TABLET | Freq: Three times a day (TID) | ORAL | 0 refills | Status: AC

## 2021-07-22 NOTE — Patient Instructions (Signed)
Medication Instructions:  Take Ibuprofen 800 mg three times daily for 7 days, then stop  Start Potassium 20 meq daily.  *If you need a refill on your cardiac medications before your next appointment, please call your pharmacy*   Follow-Up: At [facility], you and your health needs are our priority.  As part of our continuing mission to provide you with exceptional heart care, we have created designated Provider Care Teams.  These Care Teams include your primary Cardiologist (physician) and Advanced Practice Providers (APPs -  Physician Assistants and Nurse Practitioners) who all work together to provide you with the care you need, when you need it.  We recommend signing up for the patient portal called "MyChart".  Sign up information is provided on this After Visit Summary.  MyChart is used to connect with patients for Virtual Visits (Telemedicine).  Patients are able to view lab/test results, encounter notes, upcoming appointments, etc.  Non-urgent messages can be sent to your provider as well.   To learn more about what you can do with MyChart, go to [email or web address].    Your next appointment:   As needed  The format for your next appointment:   In Person  Provider:   Lennie Odor, MD   Call us back if symptoms do not improve.

## 2021-07-22 NOTE — Progress Notes (Signed)
Cardiology Office Note:   Date:  07/22/2021  NAME:  Leslie Dorsey    MRN: [id number] DOB:  Aug 24, 1951   PCP:  Farris Has, MD  Cardiologist:  Nicki Guadalajara, MD  Electrophysiologist:  None   Referring MD: Farris Has, MD   Chief Complaint  Patient presents with   Chest Pain   History of Present Illness:   Leslie Dorsey is a 70 y.o. female with a hx of CAD, HTN, tobacco abuse who presents for evaluation of chest pain.  She was seen in the emergency room on 07/17/2021.  She reports she had sudden abrupt left chest pressure.  She also reports she has had right chest pressure as well.  She describes it as being dull and achy.  Can be sharp.  Can be pressure.  She was seen in the emergency room and troponins were negative.  EKG was unremarkable.  She is continue to have on and off symptoms.  She was recently diagnosed with bronchitis by her primary care physician 3 weeks ago.  She has had increased phlegm production.  No fevers or chills recently.  She reports the pain can be worsened with inspiration.  No association with food.  No association with exertion.  She reports it is not lessened by cessation of activity.  Her blood pressure was elevated the emergency room.  It is normal here.  She also describes that she had a pounding sensation in her chest.  This coincided with a potassium of 3.3.  She apparently cannot take losartan.  This was started by her cardiologist at their last appointment.  Her EKG in office demonstrates normal sinus rhythm with no acute ischemic changes or evidence of infarction.  Symptoms appear to not be improving so she requested evaluation today.  She reports no increase stress in her life.  She is still grieving the loss of her husband.  She is still smoking but she is down to 1/4 pack/day.  She has smoked since she was 70 years of age.  She is working on getting her cigarette count to 0.  No other change in her medical history.  Reassuring that troponins were  negative and EKG is normal.  Problem List CAD -STEMI 08/2015 -PCI to pRCA 2. HTN 3. Tobacco abuse   Past Medical History: Past Medical History:  Diagnosis Date   CAD (coronary artery disease)    a. Inf STEMI 08/2015 - inferior STEMI 08/2015 s/p DES to RCA.   Essential hypertension    Hyperglycemia    a. 08/2015: A1C 5.8.   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 (HCC)    Tobacco abuse     Past Surgical History: Past Surgical History:  Procedure Laterality Date   ABDOMINAL HYSTERECTOMY     CARDIAC CATHETERIZATION N/A 08/26/2015   Procedure: Left Heart Cath and Coronary Angiography;  Surgeon: Lennette Bihari, MD;  Location: [facility];  Service: Cardiovascular;  Laterality: N/A;   CARDIAC CATHETERIZATION N/A 08/26/2015   Procedure: Coronary Stent Intervention;  Surgeon: Lennette Bihari, MD;  Location: [facility];  Service: Cardiovascular;  Laterality: N/A;   HEMORROIDECTOMY      Current Medications: Current Meds  Medication Sig   albuterol (PROVENTIL HFA;VENTOLIN HFA) 108 (90 Base) MCG/ACT inhaler Inhale 1-2 puffs into the lungs every 6 (six) hours as needed for wheezing or shortness of breath.   amLODipine (NORVASC) 2.5 MG tablet Take 1 tablet (  2.5 mg total) by mouth daily.   aspirin EC 81 MG EC tablet Take 1 tablet (81 mg total) by mouth daily.   Cholecalciferol (VITAMIN D3) 125 MCG (5000 UT) CAPS Take 5,000 Units by mouth every other day.   clonazePAM (KLONOPIN) 1 MG tablet Take 1 mg by mouth daily as needed for anxiety.    clopidogrel (PLAVIX) 75 MG tablet TAKE 1 TABLET BY MOUTH EVERY DAY   Coenzyme Q10 (COQ-10 PO) Take 200 mg by mouth daily.   cyclobenzaprine (FLEXERIL) 10 MG tablet Take 5 mg by mouth daily as needed for muscle spasms.    ezetimibe (ZETIA) 10 MG tablet TAKE 1 TABLET BY MOUTH EVERY DAY   hydrochlorothiazide (HYDRODIURIL) 25 MG tablet Take 25 mg by mouth every morning.    ibuprofen (ADVIL) 800 MG tablet Take 1 tablet (800 mg total) by mouth 3 (three) times daily.   loratadine (CLARITIN) 10 MG tablet Take 10 mg by mouth daily as needed for allergies.   nicotine (NICODERM CQ - DOSED IN MG/24 HOURS) 21 mg/24hr patch PLEASE SEE ATTACHED FOR DETAILED DIRECTIONS   nitroGLYCERIN (NITROSTAT) 0.4 MG SL tablet PLACE 1 TABLET UNDER TONGUE EVERY 5 MINUTES FOR 3 DOSES AS NEEDED FOR CHEST PAIN   OMEGA-3 FATTY ACIDS PO Take 800 mg by mouth daily.   oxyCODONE-acetaminophen (PERCOCET) 10-325 MG tablet TAKE 1/4 TABLET FOUR TIMES A DAY AS NEEDED   potassium chloride SA (KLOR-CON) 20 MEQ tablet Take 1 tablet (20 mEq total) by mouth daily.   rosuvastatin (CRESTOR) 20 MG tablet Take 0.5 tablets (10 mg total) by mouth daily.   [DISCONTINUED] ibuprofen (ADVIL) 800 MG tablet Take 400 mg by mouth 2 (two) times daily as needed.     Allergies:    Brilinta [ticagrelor], Levofloxacin, Morphine and related, and Lipitor [atorvastatin]   Social History: Social History   Socioeconomic History   Marital status: Married    Spouse name: Not on file   Number of children: Not on file   Years of education: Not on file   Highest education level: Not on file  Occupational History   Not on file  Tobacco Use   Smoking status: Former    Types: Cigarettes    Quit date: 08/26/2015    Years since quitting: 5.9   Smokeless tobacco: Never  Substance and Sexual Activity   Alcohol use: No   Drug use: No   Sexual activity: Not on file  Other Topics Concern   Not on file  Social History Narrative   Not on file   Social Determinants of Health   Financial Resource Strain: Not on file  Food Insecurity: Not on file  Transportation Needs: Not on file  Physical Activity: Not on file  Stress: Not on file  Social Connections: Not on file     Family History: The patient's family history includes Heart attack in her mother; Heart disease in her mother.  ROS:   All other ROS reviewed and  negative. Pertinent positives noted in the HPI.     EKGs/Labs/Other Studies Reviewed:   The following studies were personally reviewed by me today:  EKG:  EKG is ordered today.  The ekg ordered today demonstrates sinus rhythm heart rate 70, no acute ischemic changes or evidence of infarction, and was personally reviewed by me.   LHC 08/26/2015 Ost Cx lesion, 10% stenosed. Mid LAD to Dist LAD lesion, 20% stenosed. Prox RCA lesion, 99% stenosed. Post intervention, there is a 0% residual stenosis. The left ventricular  systolic function is normal.  Recent Labs: 07/17/2021: ALT 12; BUN 15; Creatinine, Ser 0.85; Hemoglobin 13.5; Platelets 298; Potassium 3.3; Sodium 141   Recent Lipid Panel    Component Value Date/Time   CHOL 208 (H) 12/07/2016 1133   TRIG 120 12/07/2016 1133   HDL 56 12/07/2016 1133   CHOLHDL 3.7 12/07/2016 1133   VLDL 24 12/07/2016 1133   LDLCALC 128 (H) 12/07/2016 1133    Physical Exam:   VS:  BP 135/74   Pulse 70   Ht 5\' 5"  (1.651 m)   Wt 174 lb (78.9 kg)   SpO2 98%   BMI 28.96 kg/m    Wt Readings from Last 3 Encounters:  07/22/21 174 lb (78.9 kg)  01/20/21 180 lb 12.8 oz (82 kg)  07/24/19 205 lb (93 kg)    General: Well nourished, well developed, in no acute distress Head: Atraumatic, normal size  Eyes: PEERLA, EOMI  Neck: Supple, no JVD Endocrine: No thryomegaly Cardiac: Normal S1, S2; RRR; no murmurs, rubs, or gallops Lungs: Clear to auscultation bilaterally, no wheezing, rhonchi or rales  Abd: Soft, nontender, no hepatomegaly  Ext: No edema, pulses 2+ Musculoskeletal: No deformities, BUE and BLE strength normal and equal Skin: Warm and dry, no rashes   Neuro: Alert and oriented to person, place, time, and situation, CNII-XII grossly intact, no focal deficits  Psych: Normal mood and affect   ASSESSMENT:   Poleth Walson is a 70 y.o. female who presents for the following: 1. Precordial pain   2. Coronary artery disease involving native  coronary artery of native heart without angina pectoris   3. Primary hypertension   4. Hypokalemia   5. Tobacco abuse     PLAN:   1. Precordial pain -She presents with a pressure in her right chest as well as in her left chest at times.  Appears to be constant.  Not associated with exertion or alleviated by rest.  EKG in office is normal.  Recent emergency room evaluation shows high sensitive troponin value of 4.  To me symptoms are likely not related to her heart.  I did offer her a stress test but she reports she would not like to do this.  She was diagnosed with bronchitis 3 weeks ago.  She does describe increased phlegm production and possibly a pleuritic component.  I suspect this is likely musculoskeletal and have recommended ibuprofen 3 times daily for 7 days.  This will knock out any inflammation.  She should also continue her inhalers as well as complete antibiotics and decongestants that were prescribed by her primary care physician.  Her symptoms of pounding likely are explained by hypokalemia.  See discussion below.  Overall her normal EKG and normal troponin values are reassuring.  Given that she does not want to do a stress test we will treat any inflammation with ibuprofen.  2. Coronary artery disease involving native coronary artery of native heart without angina pectoris -History of inferior STEMI in 2016.  Normal stress test last year.  Symptoms of chest pain which appear to be noncardiac.  Offer stress test today but she is refused.  3. Primary hypertension -Blood pressure well controlled 135/74.  She is on amlodipine 2.5 mg daily as well as HCTZ 25 mg daily.  Recently seen in the ER with hypokalemia.  We discussed that HCTZ is likely the culprit here.  Instead of changing medication she would like to take a potassium supplement.  We will give her 20 mEq potassium  to take with her HCTZ. -I think she should keep a log of her blood pressure moving forward.  Her symptoms could be  explained by elevated blood pressure she reports it was elevated in the emergency room.  4. Hypokalemia -Start potassium supplement.  5. Tobacco abuse -4 minutes of smoking cessation counseling was provided in office today.  Disposition: Return if symptoms worsen or fail to improve.  Medication Adjustments/Labs and Tests Ordered: Current medicines are reviewed at length with the patient today.  Concerns regarding medicines are outlined above.  Orders Placed This Encounter  Procedures   EKG 12-Lead    Meds ordered this encounter  Medications   ibuprofen (ADVIL) 800 MG tablet    Sig: Take 1 tablet (800 mg total) by mouth 3 (three) times daily.    Dispense:  21 tablet    Refill:  0   potassium chloride SA (KLOR-CON) 20 MEQ tablet    Sig: Take 1 tablet (20 mEq total) by mouth daily.    Dispense:  90 tablet    Refill:  3     Patient Instructions  Medication Instructions:  Take Ibuprofen 800 mg three times daily for 7 days, then stop  Start Potassium 20 meq daily.  *If you need a refill on your cardiac medications before your next appointment, please call your pharmacy*   Follow-Up: At [facility], you and your health needs are our priority.  As part of our continuing mission to provide you with exceptional heart care, we have created designated Provider Care Teams.  These Care Teams include your primary Cardiologist (physician) and Advanced Practice Providers (APPs -  Physician Assistants and Nurse Practitioners) who all work together to provide you with the care you need, when you need it.  We recommend signing up for the patient portal called "MyChart".  Sign up information is provided on this After Visit Summary.  MyChart is used to connect with patients for Virtual Visits (Telemedicine).  Patients are able to view lab/test results, encounter notes, upcoming appointments, etc.  Non-urgent messages can be sent to your provider as well.   To learn more about what you can do  with MyChart, go to [email or web address].    Your next appointment:   As needed  The format for your next appointment:   In Person  Provider:   Lennie Odor, MD   Call us back if symptoms do not improve.    Time Spent with Patient: I have spent a total of 35 minutes with patient reviewing hospital notes, telemetry, EKGs, labs and examining the patient as well as establishing an assessment and plan that was discussed with the patient.  > 50% of time was spent in direct patient care.  Signed, Lenna Gilford. Flora Lipps, MD, [facility]  [facility]  [address] [place], [place] [address] [phone]  07/22/2021 11:52 AM

## 2021-09-10 DIAGNOSIS — M48061 Spinal stenosis, lumbar region without neurogenic claudication: Secondary | ICD-10-CM | POA: Diagnosis not present

## 2021-09-10 DIAGNOSIS — M5416 Radiculopathy, lumbar region: Secondary | ICD-10-CM | POA: Diagnosis not present

## 2021-09-10 DIAGNOSIS — M47816 Spondylosis without myelopathy or radiculopathy, lumbar region: Secondary | ICD-10-CM | POA: Diagnosis not present

## 2021-09-10 DIAGNOSIS — G894 Chronic pain syndrome: Secondary | ICD-10-CM | POA: Diagnosis not present

## 2021-09-23 ENCOUNTER — Other Ambulatory Visit: Payer: Self-pay | Admitting: Physical Medicine and Rehabilitation

## 2021-09-23 DIAGNOSIS — M5416 Radiculopathy, lumbar region: Secondary | ICD-10-CM

## 2021-10-05 ENCOUNTER — Other Ambulatory Visit: Payer: Self-pay | Admitting: Cardiovascular Disease

## 2021-10-08 DIAGNOSIS — G894 Chronic pain syndrome: Secondary | ICD-10-CM | POA: Diagnosis not present

## 2021-10-08 DIAGNOSIS — M47816 Spondylosis without myelopathy or radiculopathy, lumbar region: Secondary | ICD-10-CM | POA: Diagnosis not present

## 2021-10-08 DIAGNOSIS — M5416 Radiculopathy, lumbar region: Secondary | ICD-10-CM | POA: Diagnosis not present

## 2021-10-08 DIAGNOSIS — Z79891 Long term (current) use of opiate analgesic: Secondary | ICD-10-CM | POA: Diagnosis not present

## 2021-10-12 DIAGNOSIS — I10 Essential (primary) hypertension: Secondary | ICD-10-CM | POA: Diagnosis not present

## 2021-10-12 DIAGNOSIS — E162 Hypoglycemia, unspecified: Secondary | ICD-10-CM | POA: Diagnosis not present

## 2021-10-12 DIAGNOSIS — R202 Paresthesia of skin: Secondary | ICD-10-CM | POA: Diagnosis not present

## 2021-11-05 DIAGNOSIS — M5416 Radiculopathy, lumbar region: Secondary | ICD-10-CM | POA: Diagnosis not present

## 2021-11-05 DIAGNOSIS — G894 Chronic pain syndrome: Secondary | ICD-10-CM | POA: Diagnosis not present

## 2021-11-05 DIAGNOSIS — M48061 Spinal stenosis, lumbar region without neurogenic claudication: Secondary | ICD-10-CM | POA: Diagnosis not present

## 2021-11-05 DIAGNOSIS — M47816 Spondylosis without myelopathy or radiculopathy, lumbar region: Secondary | ICD-10-CM | POA: Diagnosis not present

## 2021-12-07 DIAGNOSIS — M48061 Spinal stenosis, lumbar region without neurogenic claudication: Secondary | ICD-10-CM | POA: Diagnosis not present

## 2021-12-07 DIAGNOSIS — M5416 Radiculopathy, lumbar region: Secondary | ICD-10-CM | POA: Diagnosis not present

## 2021-12-07 DIAGNOSIS — G894 Chronic pain syndrome: Secondary | ICD-10-CM | POA: Diagnosis not present

## 2021-12-07 DIAGNOSIS — M47816 Spondylosis without myelopathy or radiculopathy, lumbar region: Secondary | ICD-10-CM | POA: Diagnosis not present

## 2022-01-04 DIAGNOSIS — M48061 Spinal stenosis, lumbar region without neurogenic claudication: Secondary | ICD-10-CM | POA: Diagnosis not present

## 2022-01-04 DIAGNOSIS — M5416 Radiculopathy, lumbar region: Secondary | ICD-10-CM | POA: Diagnosis not present

## 2022-01-04 DIAGNOSIS — G894 Chronic pain syndrome: Secondary | ICD-10-CM | POA: Diagnosis not present

## 2022-01-04 DIAGNOSIS — M47816 Spondylosis without myelopathy or radiculopathy, lumbar region: Secondary | ICD-10-CM | POA: Diagnosis not present

## 2022-01-13 DIAGNOSIS — I2510 Atherosclerotic heart disease of native coronary artery without angina pectoris: Secondary | ICD-10-CM | POA: Diagnosis not present

## 2022-01-13 DIAGNOSIS — R7309 Other abnormal glucose: Secondary | ICD-10-CM | POA: Diagnosis not present

## 2022-01-13 DIAGNOSIS — I10 Essential (primary) hypertension: Secondary | ICD-10-CM | POA: Diagnosis not present

## 2022-01-13 DIAGNOSIS — R062 Wheezing: Secondary | ICD-10-CM | POA: Diagnosis not present

## 2022-01-13 DIAGNOSIS — M5430 Sciatica, unspecified side: Secondary | ICD-10-CM | POA: Diagnosis not present

## 2022-01-13 DIAGNOSIS — E785 Hyperlipidemia, unspecified: Secondary | ICD-10-CM | POA: Diagnosis not present

## 2022-02-03 DIAGNOSIS — M5416 Radiculopathy, lumbar region: Secondary | ICD-10-CM | POA: Diagnosis not present

## 2022-02-03 DIAGNOSIS — M47816 Spondylosis without myelopathy or radiculopathy, lumbar region: Secondary | ICD-10-CM | POA: Diagnosis not present

## 2022-02-03 DIAGNOSIS — G894 Chronic pain syndrome: Secondary | ICD-10-CM | POA: Diagnosis not present

## 2022-02-03 DIAGNOSIS — M48061 Spinal stenosis, lumbar region without neurogenic claudication: Secondary | ICD-10-CM | POA: Diagnosis not present

## 2022-03-04 DIAGNOSIS — M47816 Spondylosis without myelopathy or radiculopathy, lumbar region: Secondary | ICD-10-CM | POA: Diagnosis not present

## 2022-03-04 DIAGNOSIS — M48061 Spinal stenosis, lumbar region without neurogenic claudication: Secondary | ICD-10-CM | POA: Diagnosis not present

## 2022-03-04 DIAGNOSIS — G894 Chronic pain syndrome: Secondary | ICD-10-CM | POA: Diagnosis not present

## 2022-03-04 DIAGNOSIS — M5416 Radiculopathy, lumbar region: Secondary | ICD-10-CM | POA: Diagnosis not present

## 2022-04-01 DIAGNOSIS — G894 Chronic pain syndrome: Secondary | ICD-10-CM | POA: Diagnosis not present

## 2022-04-01 DIAGNOSIS — M5416 Radiculopathy, lumbar region: Secondary | ICD-10-CM | POA: Diagnosis not present

## 2022-04-01 DIAGNOSIS — M48061 Spinal stenosis, lumbar region without neurogenic claudication: Secondary | ICD-10-CM | POA: Diagnosis not present

## 2022-04-01 DIAGNOSIS — M47816 Spondylosis without myelopathy or radiculopathy, lumbar region: Secondary | ICD-10-CM | POA: Diagnosis not present

## 2022-04-15 ENCOUNTER — Other Ambulatory Visit: Payer: Self-pay | Admitting: Physician Assistant

## 2022-04-29 DIAGNOSIS — M47816 Spondylosis without myelopathy or radiculopathy, lumbar region: Secondary | ICD-10-CM | POA: Diagnosis not present

## 2022-04-29 DIAGNOSIS — M5416 Radiculopathy, lumbar region: Secondary | ICD-10-CM | POA: Diagnosis not present

## 2022-04-29 DIAGNOSIS — M48061 Spinal stenosis, lumbar region without neurogenic claudication: Secondary | ICD-10-CM | POA: Diagnosis not present

## 2022-04-29 DIAGNOSIS — G894 Chronic pain syndrome: Secondary | ICD-10-CM | POA: Diagnosis not present

## 2022-06-07 DIAGNOSIS — R0789 Other chest pain: Secondary | ICD-10-CM | POA: Diagnosis not present

## 2022-06-07 DIAGNOSIS — Z720 Tobacco use: Secondary | ICD-10-CM | POA: Diagnosis not present

## 2022-06-29 DIAGNOSIS — M48061 Spinal stenosis, lumbar region without neurogenic claudication: Secondary | ICD-10-CM | POA: Diagnosis not present

## 2022-06-29 DIAGNOSIS — G894 Chronic pain syndrome: Secondary | ICD-10-CM | POA: Diagnosis not present

## 2022-06-29 DIAGNOSIS — M47816 Spondylosis without myelopathy or radiculopathy, lumbar region: Secondary | ICD-10-CM | POA: Diagnosis not present

## 2022-06-29 DIAGNOSIS — M5416 Radiculopathy, lumbar region: Secondary | ICD-10-CM | POA: Diagnosis not present

## 2022-07-12 ENCOUNTER — Other Ambulatory Visit: Payer: Self-pay | Admitting: Physician Assistant

## 2022-07-23 DIAGNOSIS — I2510 Atherosclerotic heart disease of native coronary artery without angina pectoris: Secondary | ICD-10-CM | POA: Diagnosis not present

## 2022-07-23 DIAGNOSIS — Z0000 Encounter for general adult medical examination without abnormal findings: Secondary | ICD-10-CM | POA: Diagnosis not present

## 2022-07-23 DIAGNOSIS — R7309 Other abnormal glucose: Secondary | ICD-10-CM | POA: Diagnosis not present

## 2022-07-23 DIAGNOSIS — E785 Hyperlipidemia, unspecified: Secondary | ICD-10-CM | POA: Diagnosis not present

## 2022-07-23 DIAGNOSIS — Z720 Tobacco use: Secondary | ICD-10-CM | POA: Diagnosis not present

## 2022-07-23 DIAGNOSIS — I10 Essential (primary) hypertension: Secondary | ICD-10-CM | POA: Diagnosis not present

## 2022-07-23 IMAGING — CR DG CHEST 2V
2 series · 2 of 2 positions shown · non-contrast
Comparison: Chest radiograph dated 05/15/2018.

CLINICAL DATA: Chest pain.

EXAM:
CHEST - 2 VIEW

[chest pa]
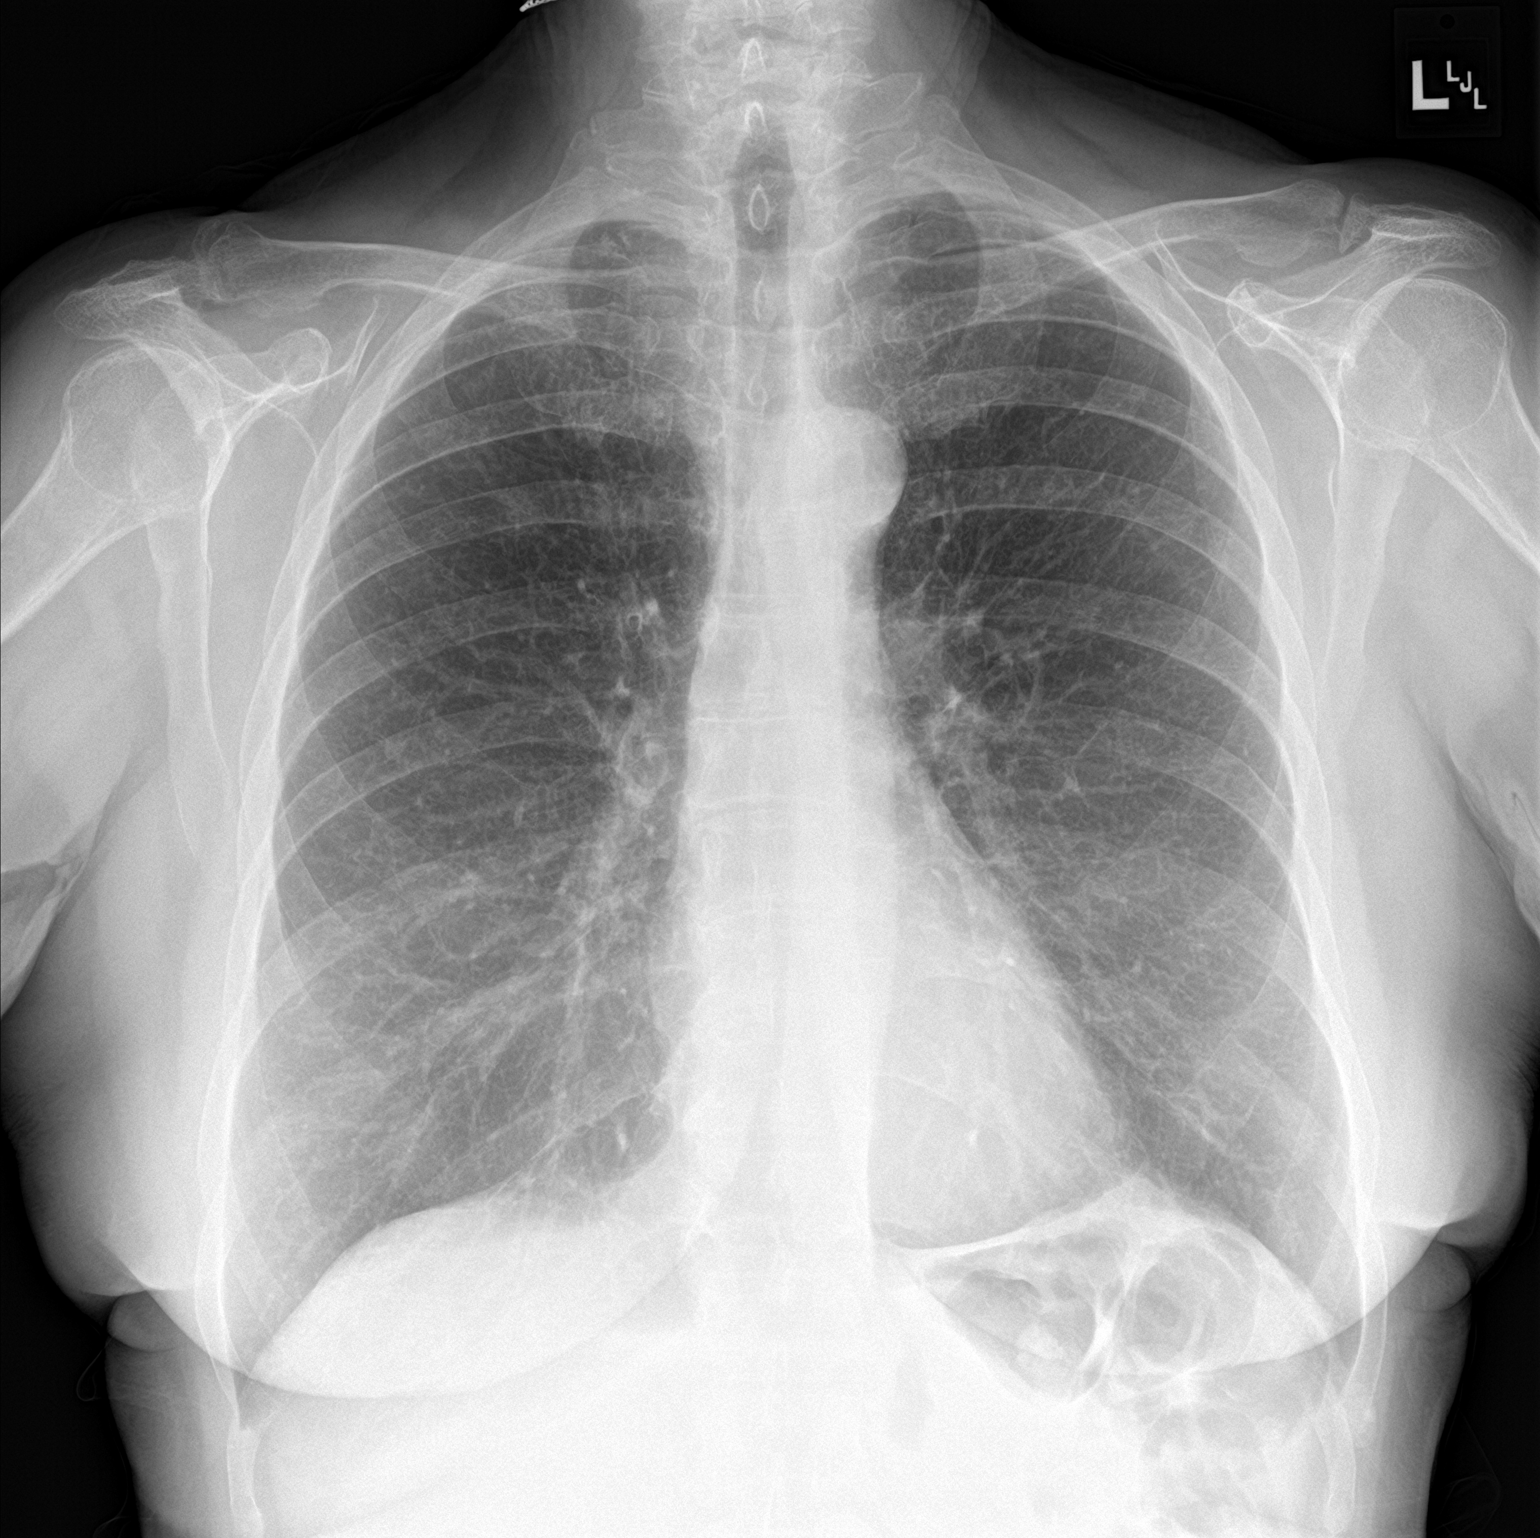

[chest lat]
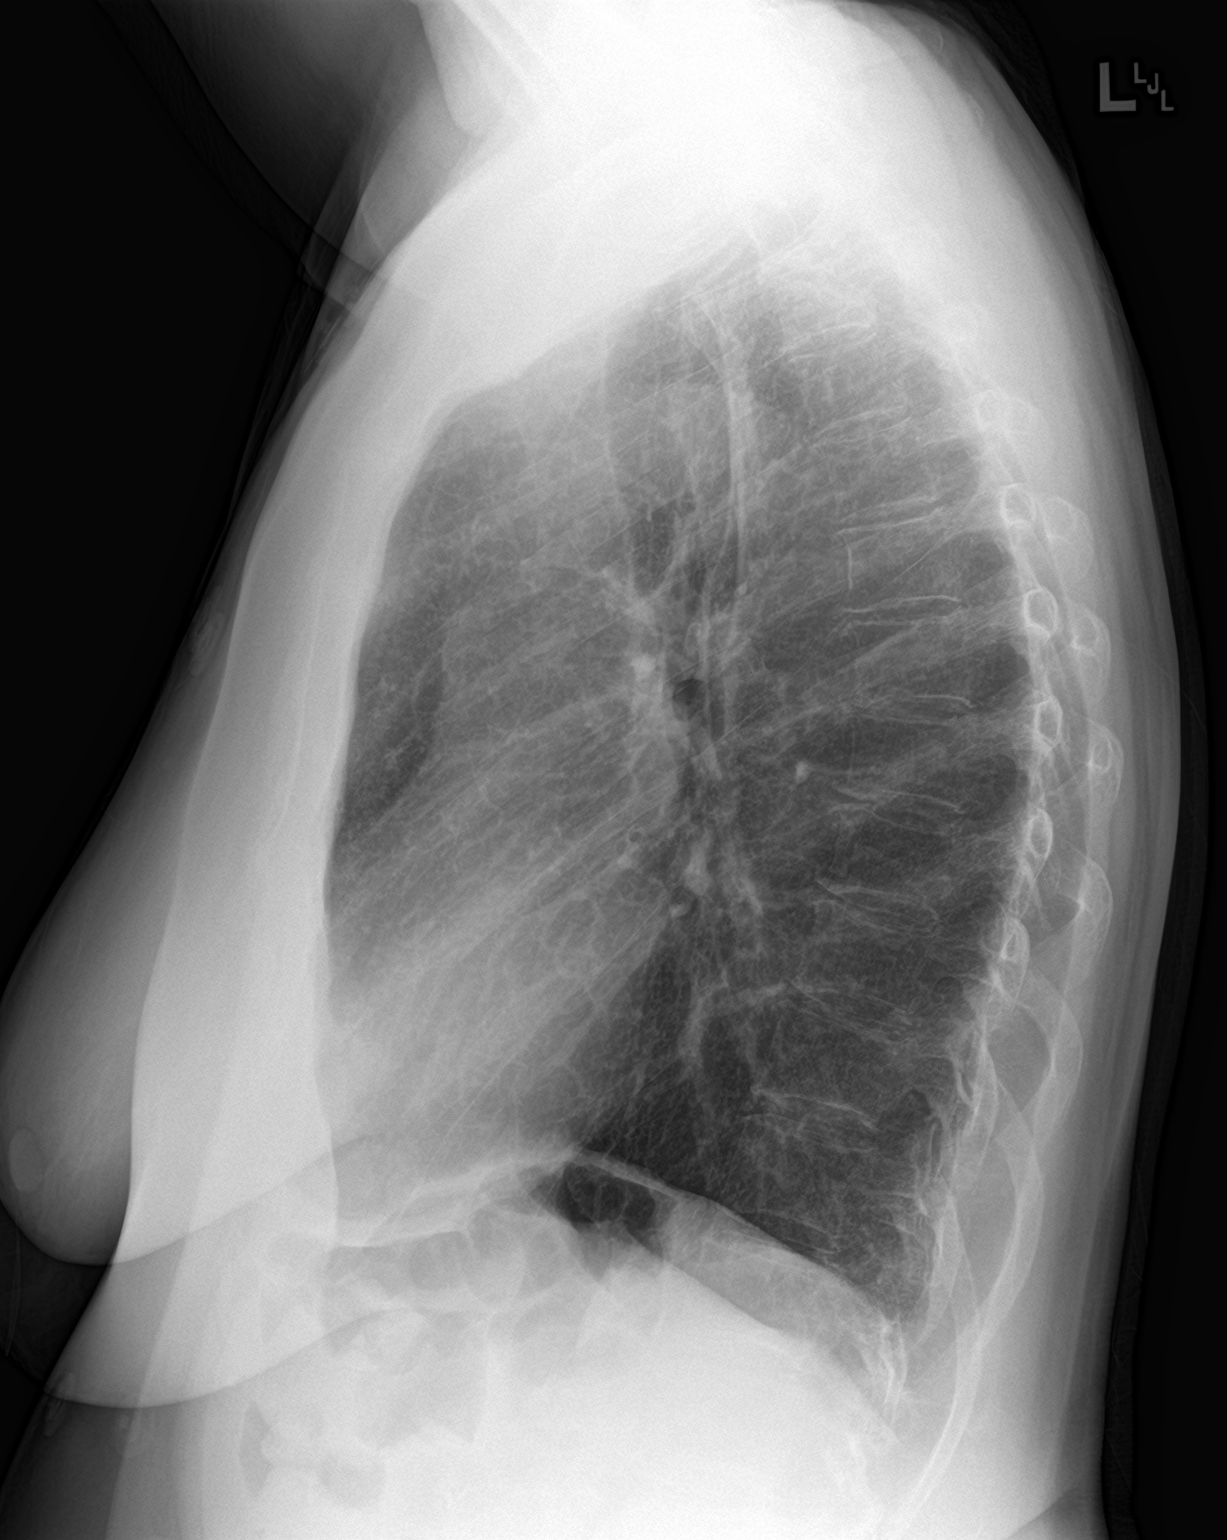

[2 of 2 positions shown; findings below may reference images not displayed]

FINDINGS: The heart size and mediastinal contours are within normal limits.
Both lungs are clear. The visualized skeletal structures are
unremarkable.
IMPRESSION: No active cardiopulmonary disease.

## 2022-07-23 IMAGING — DX DG CHEST 2V
2 series · 2 of 2 positions shown · non-contrast
Comparison: Chest x-ray 07/17/2021.

CLINICAL DATA: Chest pain.

EXAM:
CHEST - 2 VIEW

[chest pa]
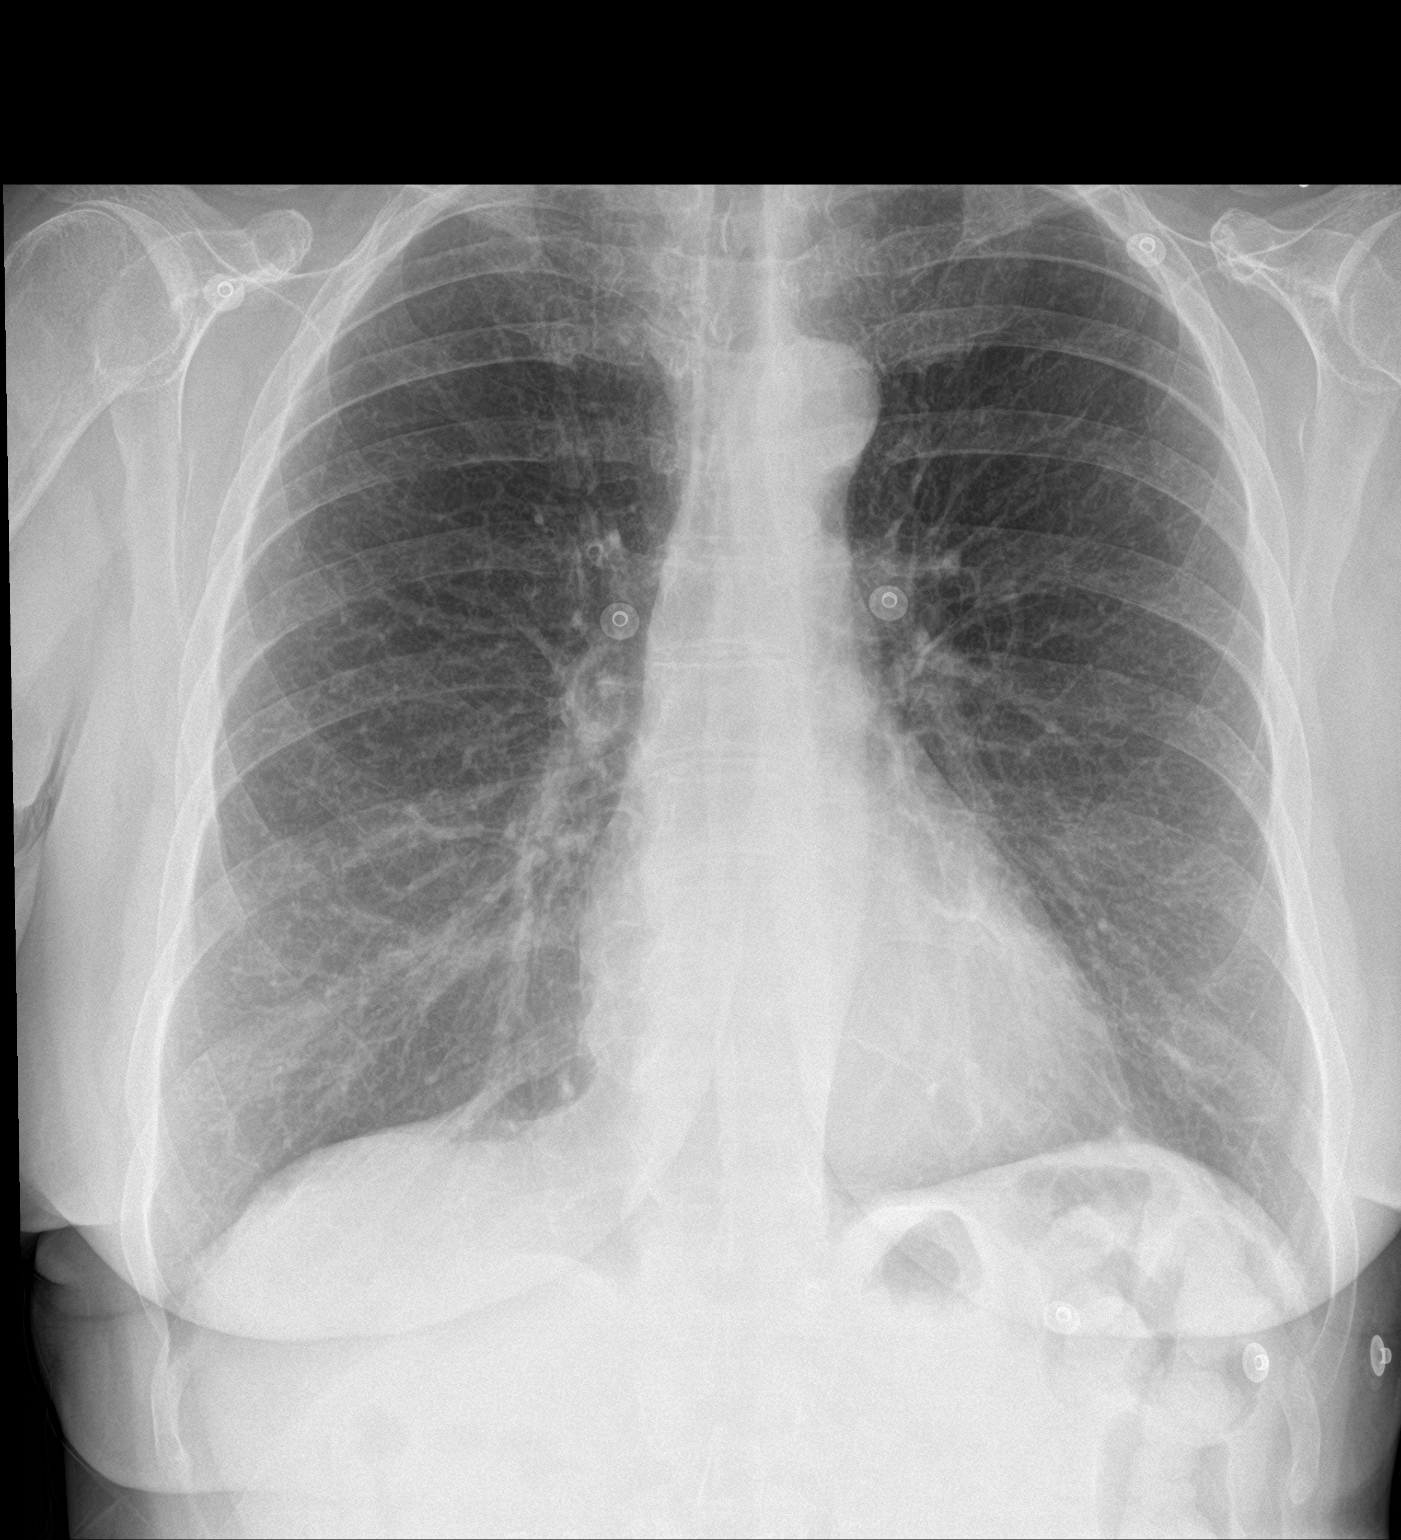

[chest lat]
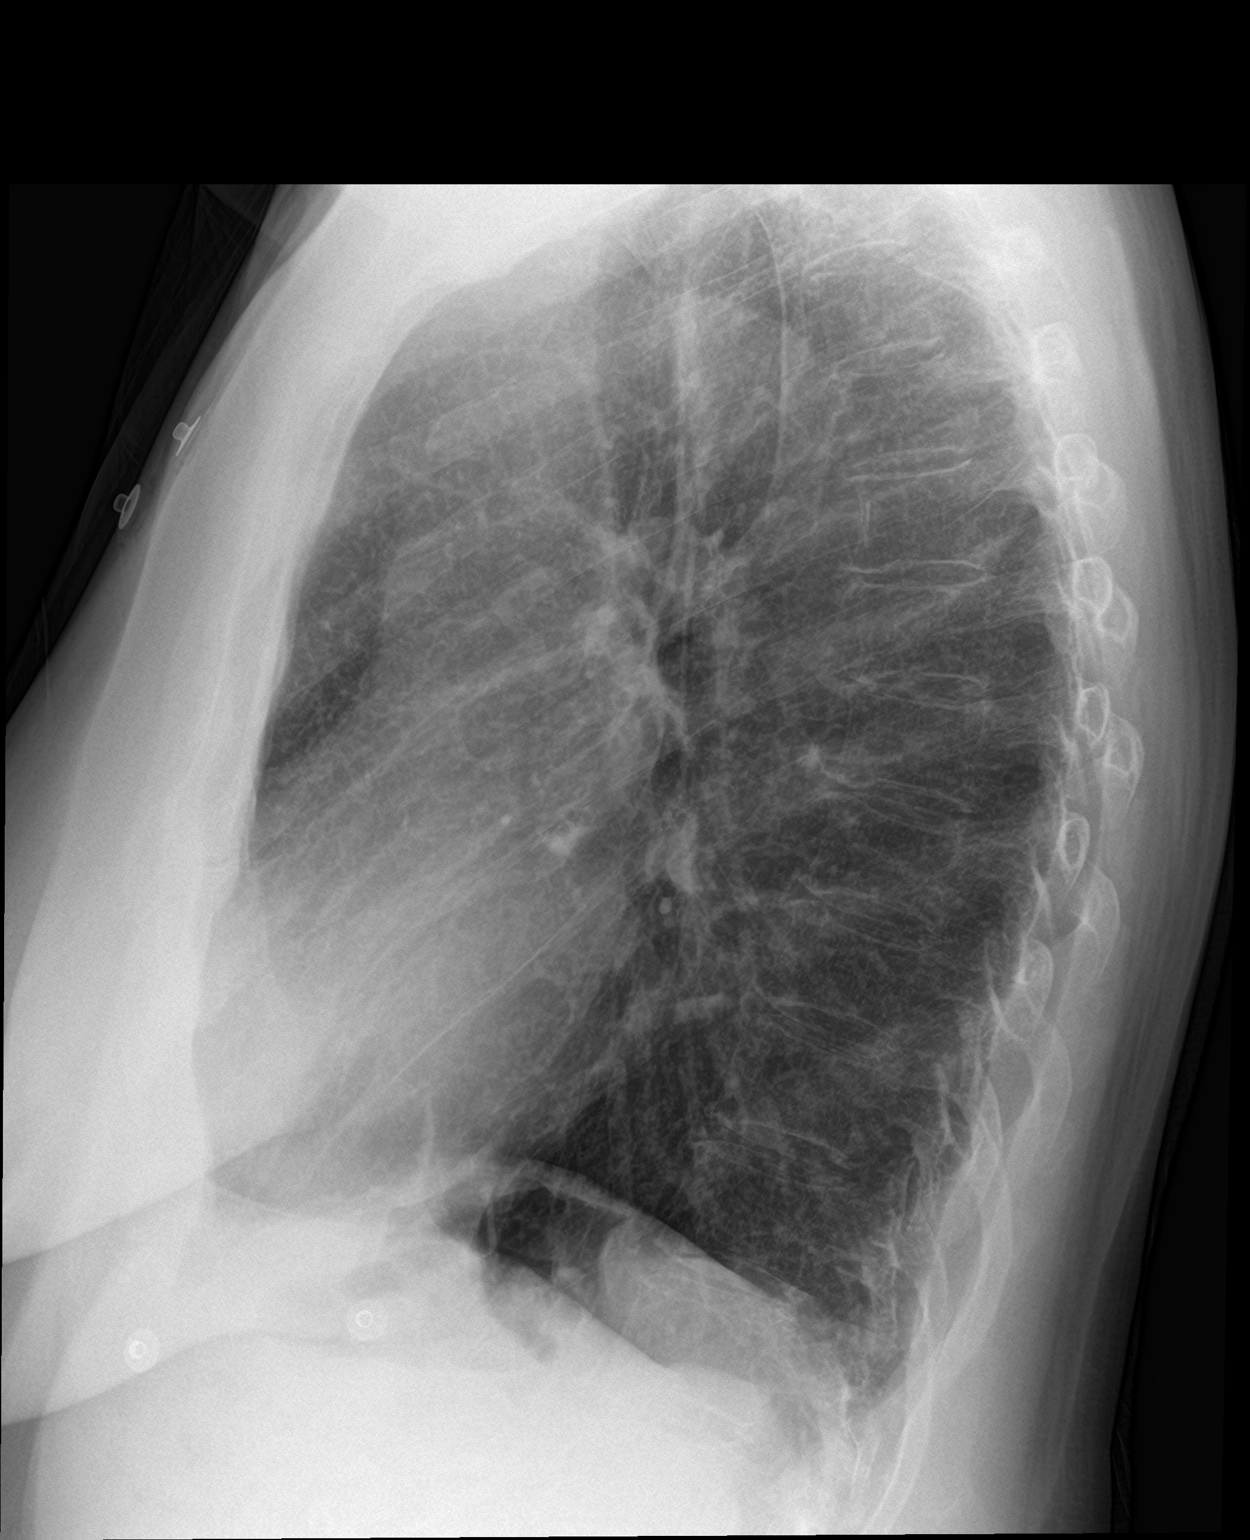

[2 of 2 positions shown; findings below may reference images not displayed]

FINDINGS: The heart size and mediastinal contours are within normal limits.
Both lungs are clear. The visualized skeletal structures are
unremarkable.
IMPRESSION: No active cardiopulmonary disease.

## 2022-07-28 DIAGNOSIS — G894 Chronic pain syndrome: Secondary | ICD-10-CM | POA: Diagnosis not present

## 2022-07-28 DIAGNOSIS — M47816 Spondylosis without myelopathy or radiculopathy, lumbar region: Secondary | ICD-10-CM | POA: Diagnosis not present

## 2022-07-28 DIAGNOSIS — M5416 Radiculopathy, lumbar region: Secondary | ICD-10-CM | POA: Diagnosis not present

## 2022-07-28 DIAGNOSIS — M48061 Spinal stenosis, lumbar region without neurogenic claudication: Secondary | ICD-10-CM | POA: Diagnosis not present

## 2022-08-25 DIAGNOSIS — M47816 Spondylosis without myelopathy or radiculopathy, lumbar region: Secondary | ICD-10-CM | POA: Diagnosis not present

## 2022-08-25 DIAGNOSIS — M48061 Spinal stenosis, lumbar region without neurogenic claudication: Secondary | ICD-10-CM | POA: Diagnosis not present

## 2022-08-25 DIAGNOSIS — M5416 Radiculopathy, lumbar region: Secondary | ICD-10-CM | POA: Diagnosis not present

## 2022-08-25 DIAGNOSIS — G894 Chronic pain syndrome: Secondary | ICD-10-CM | POA: Diagnosis not present

## 2022-09-22 DIAGNOSIS — G894 Chronic pain syndrome: Secondary | ICD-10-CM | POA: Diagnosis not present

## 2022-09-22 DIAGNOSIS — M48061 Spinal stenosis, lumbar region without neurogenic claudication: Secondary | ICD-10-CM | POA: Diagnosis not present

## 2022-09-22 DIAGNOSIS — M47816 Spondylosis without myelopathy or radiculopathy, lumbar region: Secondary | ICD-10-CM | POA: Diagnosis not present

## 2022-09-22 DIAGNOSIS — M5416 Radiculopathy, lumbar region: Secondary | ICD-10-CM | POA: Diagnosis not present

## 2022-10-16 ENCOUNTER — Other Ambulatory Visit: Payer: Self-pay | Admitting: Cardiovascular Disease

## 2022-10-20 DIAGNOSIS — M47816 Spondylosis without myelopathy or radiculopathy, lumbar region: Secondary | ICD-10-CM | POA: Diagnosis not present

## 2022-10-20 DIAGNOSIS — M5416 Radiculopathy, lumbar region: Secondary | ICD-10-CM | POA: Diagnosis not present

## 2022-10-20 DIAGNOSIS — G894 Chronic pain syndrome: Secondary | ICD-10-CM | POA: Diagnosis not present

## 2022-10-20 DIAGNOSIS — M48061 Spinal stenosis, lumbar region without neurogenic claudication: Secondary | ICD-10-CM | POA: Diagnosis not present

## 2022-11-24 DIAGNOSIS — M5416 Radiculopathy, lumbar region: Secondary | ICD-10-CM | POA: Diagnosis not present

## 2022-11-24 DIAGNOSIS — M48061 Spinal stenosis, lumbar region without neurogenic claudication: Secondary | ICD-10-CM | POA: Diagnosis not present

## 2022-11-24 DIAGNOSIS — G894 Chronic pain syndrome: Secondary | ICD-10-CM | POA: Diagnosis not present

## 2022-11-24 DIAGNOSIS — M47816 Spondylosis without myelopathy or radiculopathy, lumbar region: Secondary | ICD-10-CM | POA: Diagnosis not present

## 2022-12-23 DIAGNOSIS — M48061 Spinal stenosis, lumbar region without neurogenic claudication: Secondary | ICD-10-CM | POA: Diagnosis not present

## 2022-12-23 DIAGNOSIS — M5416 Radiculopathy, lumbar region: Secondary | ICD-10-CM | POA: Diagnosis not present

## 2022-12-23 DIAGNOSIS — G894 Chronic pain syndrome: Secondary | ICD-10-CM | POA: Diagnosis not present

## 2022-12-23 DIAGNOSIS — M47816 Spondylosis without myelopathy or radiculopathy, lumbar region: Secondary | ICD-10-CM | POA: Diagnosis not present

## 2023-01-06 ENCOUNTER — Encounter: Payer: Self-pay | Admitting: Physical Medicine and Rehabilitation

## 2023-01-20 DIAGNOSIS — G894 Chronic pain syndrome: Secondary | ICD-10-CM | POA: Diagnosis not present

## 2023-01-20 DIAGNOSIS — M5416 Radiculopathy, lumbar region: Secondary | ICD-10-CM | POA: Diagnosis not present

## 2023-01-20 DIAGNOSIS — M48061 Spinal stenosis, lumbar region without neurogenic claudication: Secondary | ICD-10-CM | POA: Diagnosis not present

## 2023-01-20 DIAGNOSIS — M47816 Spondylosis without myelopathy or radiculopathy, lumbar region: Secondary | ICD-10-CM | POA: Diagnosis not present

## 2023-01-21 ENCOUNTER — Other Ambulatory Visit: Payer: Self-pay | Admitting: Physical Medicine and Rehabilitation

## 2023-01-21 DIAGNOSIS — M48061 Spinal stenosis, lumbar region without neurogenic claudication: Secondary | ICD-10-CM

## 2023-01-23 ENCOUNTER — Ambulatory Visit
Admission: RE | Admit: 2023-01-23 | Discharge: 2023-01-23 | Disposition: A | Discharge status: Home / Self Care | Payer: Medicare Other | Source: Ambulatory Visit | Attending: Physical Medicine and Rehabilitation | Admitting: Physical Medicine and Rehabilitation

## 2023-01-23 DIAGNOSIS — M47816 Spondylosis without myelopathy or radiculopathy, lumbar region: Secondary | ICD-10-CM | POA: Diagnosis not present

## 2023-01-23 DIAGNOSIS — M48061 Spinal stenosis, lumbar region without neurogenic claudication: Secondary | ICD-10-CM

## 2023-01-23 DIAGNOSIS — M4316 Spondylolisthesis, lumbar region: Secondary | ICD-10-CM | POA: Diagnosis not present

## 2023-01-24 DIAGNOSIS — J988 Other specified respiratory disorders: Secondary | ICD-10-CM | POA: Diagnosis not present

## 2023-01-24 DIAGNOSIS — Z720 Tobacco use: Secondary | ICD-10-CM | POA: Diagnosis not present

## 2023-01-24 DIAGNOSIS — R7309 Other abnormal glucose: Secondary | ICD-10-CM | POA: Diagnosis not present

## 2023-01-24 DIAGNOSIS — I25119 Atherosclerotic heart disease of native coronary artery with unspecified angina pectoris: Secondary | ICD-10-CM | POA: Diagnosis not present

## 2023-01-24 DIAGNOSIS — I10 Essential (primary) hypertension: Secondary | ICD-10-CM | POA: Diagnosis not present

## 2023-01-24 DIAGNOSIS — E785 Hyperlipidemia, unspecified: Secondary | ICD-10-CM | POA: Diagnosis not present

## 2023-01-24 DIAGNOSIS — I2510 Atherosclerotic heart disease of native coronary artery without angina pectoris: Secondary | ICD-10-CM | POA: Diagnosis not present

## 2023-02-17 ENCOUNTER — Other Ambulatory Visit: Payer: Self-pay | Admitting: Cardiovascular Disease

## 2023-02-17 DIAGNOSIS — Z79891 Long term (current) use of opiate analgesic: Secondary | ICD-10-CM | POA: Diagnosis not present

## 2023-02-17 DIAGNOSIS — M47816 Spondylosis without myelopathy or radiculopathy, lumbar region: Secondary | ICD-10-CM | POA: Diagnosis not present

## 2023-02-17 DIAGNOSIS — M48061 Spinal stenosis, lumbar region without neurogenic claudication: Secondary | ICD-10-CM | POA: Diagnosis not present

## 2023-02-17 DIAGNOSIS — G894 Chronic pain syndrome: Secondary | ICD-10-CM | POA: Diagnosis not present

## 2023-02-17 DIAGNOSIS — M5416 Radiculopathy, lumbar region: Secondary | ICD-10-CM | POA: Diagnosis not present

## 2023-03-23 ENCOUNTER — Telehealth (HOSPITAL_BASED_OUTPATIENT_CLINIC_OR_DEPARTMENT_OTHER): Payer: Self-pay

## 2023-03-23 ENCOUNTER — Encounter (HOSPITAL_BASED_OUTPATIENT_CLINIC_OR_DEPARTMENT_OTHER): Payer: Self-pay | Admitting: Emergency Medicine

## 2023-03-23 ENCOUNTER — Other Ambulatory Visit: Payer: Self-pay

## 2023-03-23 ENCOUNTER — Inpatient Hospital Stay (HOSPITAL_BASED_OUTPATIENT_CLINIC_OR_DEPARTMENT_OTHER)
Admission: EM | Admit: 2023-03-23 | Discharge: 2023-03-26 | DRG: 871 | Disposition: A | Discharge status: Home / Self Care | Payer: Medicare Other | Attending: Family Medicine | Admitting: Family Medicine

## 2023-03-23 ENCOUNTER — Emergency Department (HOSPITAL_BASED_OUTPATIENT_CLINIC_OR_DEPARTMENT_OTHER): Payer: Medicare Other

## 2023-03-23 DIAGNOSIS — J189 Pneumonia, unspecified organism: Secondary | ICD-10-CM | POA: Diagnosis present

## 2023-03-23 DIAGNOSIS — R918 Other nonspecific abnormal finding of lung field: Secondary | ICD-10-CM | POA: Diagnosis not present

## 2023-03-23 DIAGNOSIS — Z9981 Dependence on supplemental oxygen: Secondary | ICD-10-CM | POA: Diagnosis not present

## 2023-03-23 DIAGNOSIS — R0902 Hypoxemia: Secondary | ICD-10-CM | POA: Diagnosis not present

## 2023-03-23 DIAGNOSIS — I252 Old myocardial infarction: Secondary | ICD-10-CM

## 2023-03-23 DIAGNOSIS — W57XXXA Bitten or stung by nonvenomous insect and other nonvenomous arthropods, initial encounter: Secondary | ICD-10-CM | POA: Diagnosis present

## 2023-03-23 DIAGNOSIS — G9341 Metabolic encephalopathy: Secondary | ICD-10-CM | POA: Diagnosis not present

## 2023-03-23 DIAGNOSIS — Z90710 Acquired absence of both cervix and uterus: Secondary | ICD-10-CM

## 2023-03-23 DIAGNOSIS — Z79899 Other long term (current) drug therapy: Secondary | ICD-10-CM

## 2023-03-23 DIAGNOSIS — Z885 Allergy status to narcotic agent status: Secondary | ICD-10-CM | POA: Diagnosis not present

## 2023-03-23 DIAGNOSIS — Z1152 Encounter for screening for COVID-19: Secondary | ICD-10-CM

## 2023-03-23 DIAGNOSIS — E785 Hyperlipidemia, unspecified: Secondary | ICD-10-CM | POA: Diagnosis present

## 2023-03-23 DIAGNOSIS — Z881 Allergy status to other antibiotic agents status: Secondary | ICD-10-CM

## 2023-03-23 DIAGNOSIS — J9601 Acute respiratory failure with hypoxia: Secondary | ICD-10-CM | POA: Diagnosis present

## 2023-03-23 DIAGNOSIS — Z91013 Allergy to seafood: Secondary | ICD-10-CM | POA: Diagnosis not present

## 2023-03-23 DIAGNOSIS — Z7902 Long term (current) use of antithrombotics/antiplatelets: Secondary | ICD-10-CM | POA: Diagnosis not present

## 2023-03-23 DIAGNOSIS — I10 Essential (primary) hypertension: Secondary | ICD-10-CM | POA: Diagnosis not present

## 2023-03-23 DIAGNOSIS — A419 Sepsis, unspecified organism: Secondary | ICD-10-CM | POA: Diagnosis not present

## 2023-03-23 DIAGNOSIS — R0602 Shortness of breath: Secondary | ICD-10-CM | POA: Diagnosis not present

## 2023-03-23 DIAGNOSIS — F17210 Nicotine dependence, cigarettes, uncomplicated: Secondary | ICD-10-CM | POA: Diagnosis not present

## 2023-03-23 DIAGNOSIS — Z03818 Encounter for observation for suspected exposure to other biological agents ruled out: Secondary | ICD-10-CM | POA: Diagnosis not present

## 2023-03-23 DIAGNOSIS — Z955 Presence of coronary angioplasty implant and graft: Secondary | ICD-10-CM | POA: Diagnosis not present

## 2023-03-23 DIAGNOSIS — J45901 Unspecified asthma with (acute) exacerbation: Secondary | ICD-10-CM

## 2023-03-23 DIAGNOSIS — Z7982 Long term (current) use of aspirin: Secondary | ICD-10-CM | POA: Diagnosis not present

## 2023-03-23 DIAGNOSIS — I2510 Atherosclerotic heart disease of native coronary artery without angina pectoris: Secondary | ICD-10-CM | POA: Diagnosis present

## 2023-03-23 DIAGNOSIS — Z8249 Family history of ischemic heart disease and other diseases of the circulatory system: Secondary | ICD-10-CM

## 2023-03-23 DIAGNOSIS — Z888 Allergy status to other drugs, medicaments and biological substances status: Secondary | ICD-10-CM

## 2023-03-23 LAB — BLOOD GAS, VENOUS
Acid-Base Excess: 6.5 mmol/L — ABNORMAL HIGH (ref 0.0–2.0)
Bicarbonate: 34.2 mmol/L — ABNORMAL HIGH (ref 20.0–28.0)
O2 Saturation: 63.1 %
Patient temperature: 36.1
pCO2, Ven: 60 mmHg (ref 44–60)
pH, Ven: 7.36 (ref 7.25–7.43)
pO2, Ven: 35 mmHg (ref 32–45)

## 2023-03-23 LAB — CBC
HCT: 37.5 % (ref 36.0–46.0)
HCT: 38.3 % (ref 36.0–46.0)
Hemoglobin: 12.2 g/dL (ref 12.0–15.0)
Hemoglobin: 12.4 g/dL (ref 12.0–15.0)
MCH: 29.5 pg (ref 26.0–34.0)
MCH: 30 pg (ref 26.0–34.0)
MCHC: 32.4 g/dL (ref 30.0–36.0)
MCHC: 32.5 g/dL (ref 30.0–36.0)
MCV: 91 fL (ref 80.0–100.0)
MCV: 92.1 fL (ref 80.0–100.0)
Platelets: 273 10*3/uL (ref 150–400)
Platelets: 274 10*3/uL (ref 150–400)
RBC: 4.07 MIL/uL (ref 3.87–5.11)
RBC: 4.21 MIL/uL (ref 3.87–5.11)
RDW: 12.1 % (ref 11.5–15.5)
RDW: 12.1 % (ref 11.5–15.5)
WBC: 11.5 10*3/uL — ABNORMAL HIGH (ref 4.0–10.5)
WBC: 12.5 10*3/uL — ABNORMAL HIGH (ref 4.0–10.5)
nRBC: 0 % (ref 0.0–0.2)
nRBC: 0 % (ref 0.0–0.2)

## 2023-03-23 LAB — CBC WITH DIFFERENTIAL/PLATELET
Abs Immature Granulocytes: 0.06 10*3/uL (ref 0.00–0.07)
Basophils Absolute: 0.1 10*3/uL (ref 0.0–0.1)
Basophils Relative: 0 %
Eosinophils Absolute: 0 10*3/uL (ref 0.0–0.5)
Eosinophils Relative: 0 %
HCT: 40.2 % (ref 36.0–46.0)
Hemoglobin: 13.7 g/dL (ref 12.0–15.0)
Immature Granulocytes: 1 %
Lymphocytes Relative: 12 %
Lymphs Abs: 1.4 10*3/uL (ref 0.7–4.0)
MCH: 30 pg (ref 26.0–34.0)
MCHC: 34.1 g/dL (ref 30.0–36.0)
MCV: 88.2 fL (ref 80.0–100.0)
Monocytes Absolute: 0.9 10*3/uL (ref 0.1–1.0)
Monocytes Relative: 7 %
Neutro Abs: 9.5 10*3/uL — ABNORMAL HIGH (ref 1.7–7.7)
Neutrophils Relative %: 80 %
Platelets: 295 10*3/uL (ref 150–400)
RBC: 4.56 MIL/uL (ref 3.87–5.11)
RDW: 12.1 % (ref 11.5–15.5)
WBC: 11.8 10*3/uL — ABNORMAL HIGH (ref 4.0–10.5)
nRBC: 0 % (ref 0.0–0.2)

## 2023-03-23 LAB — BASIC METABOLIC PANEL
Anion gap: 12 (ref 5–15)
BUN: 22 mg/dL (ref 8–23)
CO2: 33 mmol/L — ABNORMAL HIGH (ref 22–32)
Calcium: 8.8 mg/dL — ABNORMAL LOW (ref 8.9–10.3)
Chloride: 94 mmol/L — ABNORMAL LOW (ref 98–111)
Creatinine, Ser: 0.84 mg/dL (ref 0.44–1.00)
GFR, Estimated: >60 mL/min (ref >60)
Glucose, Bld: 104 mg/dL — ABNORMAL HIGH (ref 70–99)
Potassium: 3 mmol/L — ABNORMAL LOW (ref 3.5–5.1)
Sodium: 139 mmol/L (ref 135–145)

## 2023-03-23 LAB — CREATININE, SERUM
Creatinine, Ser: 0.8 mg/dL (ref 0.44–1.00)
GFR, Estimated: >60 mL/min (ref >60)

## 2023-03-23 LAB — BRAIN NATRIURETIC PEPTIDE: B Natriuretic Peptide: 229.4 pg/mL — ABNORMAL HIGH (ref 0.0–100.0)

## 2023-03-23 LAB — LACTIC ACID, PLASMA
Lactic Acid, Venous: 1.1 mmol/L (ref 0.5–1.9)
Lactic Acid, Venous: 1.2 mmol/L (ref 0.5–1.9)

## 2023-03-23 LAB — TROPONIN I (HIGH SENSITIVITY): Troponin I (High Sensitivity): 11 ng/L (ref <18)

## 2023-03-23 LAB — SARS CORONAVIRUS 2 BY RT PCR: SARS Coronavirus 2 by RT PCR: NEGATIVE

## 2023-03-23 MED ORDER — ENOXAPARIN SODIUM 40 MG/0.4ML IJ SOSY
40.0000 mg | PREFILLED_SYRINGE | INTRAMUSCULAR | Status: DC
  Administered 2023-03-24 – 2023-03-26 (×3): 40 mg via SUBCUTANEOUS
  Filled 2023-03-23 (×3): qty 0.4

## 2023-03-23 MED ORDER — ALBUTEROL SULFATE (2.5 MG/3ML) 0.083% IN NEBU
2.5000 mg | INHALATION_SOLUTION | RESPIRATORY_TRACT | Status: DC | PRN
  Administered 2023-03-25 – 2023-03-26 (×2): 2.5 mg via RESPIRATORY_TRACT
  Filled 2023-03-23: qty 3

## 2023-03-23 MED ORDER — ACETAMINOPHEN 650 MG RE SUPP: 650.0000 mg | Freq: Four times a day (QID) | RECTAL | Status: DC | PRN

## 2023-03-23 MED ORDER — SODIUM CHLORIDE 0.9 % IV SOLN
500.0000 mg | Freq: Once | INTRAVENOUS | Status: DC
  Filled 2023-03-23: qty 5

## 2023-03-23 MED ORDER — SODIUM CHLORIDE 0.9% FLUSH
3.0000 mL | Freq: Two times a day (BID) | INTRAVENOUS | Status: DC
  Administered 2023-03-24 – 2023-03-25 (×4): 3 mL via INTRAVENOUS

## 2023-03-23 MED ORDER — METHYLPREDNISOLONE SODIUM SUCC 125 MG IJ SOLR
125.0000 mg | Freq: Once | INTRAMUSCULAR | Status: AC
  Administered 2023-03-23: 125 mg via INTRAVENOUS
  Filled 2023-03-23: qty 2

## 2023-03-23 MED ORDER — OXYCODONE-ACETAMINOPHEN 5-325 MG PO TABS
0.5000 | ORAL_TABLET | Freq: Four times a day (QID) | ORAL | Status: DC | PRN
  Administered 2023-03-25 – 2023-03-26 (×2): 0.5 via ORAL
  Filled 2023-03-23 (×2): qty 1

## 2023-03-23 MED ORDER — LORAZEPAM 2 MG/ML IJ SOLN: 0.5000 mg | Freq: Once | INTRAMUSCULAR | Status: DC

## 2023-03-23 MED ORDER — SODIUM CHLORIDE 0.9 % IV SOLN: INTRAVENOUS | Status: DC

## 2023-03-23 MED ORDER — ALBUTEROL SULFATE HFA 108 (90 BASE) MCG/ACT IN AERS: 2.0000 | INHALATION_SPRAY | RESPIRATORY_TRACT | Status: DC | PRN

## 2023-03-23 MED ORDER — SODIUM CHLORIDE 0.9 % IV SOLN
100.0000 mg | Freq: Two times a day (BID) | INTRAVENOUS | Status: DC
  Administered 2023-03-23 – 2023-03-25 (×3): 100 mg via INTRAVENOUS
  Filled 2023-03-23 (×6): qty 100

## 2023-03-23 MED ORDER — NICOTINE 14 MG/24HR TD PT24: 14.0000 mg | MEDICATED_PATCH | Freq: Every day | TRANSDERMAL | Status: DC

## 2023-03-23 MED ORDER — IPRATROPIUM-ALBUTEROL 0.5-2.5 (3) MG/3ML IN SOLN
3.0000 mL | Freq: Once | RESPIRATORY_TRACT | Status: AC
  Administered 2023-03-23: 3 mL via RESPIRATORY_TRACT
  Filled 2023-03-23: qty 3

## 2023-03-23 MED ORDER — ROSUVASTATIN CALCIUM 10 MG PO TABS
20.0000 mg | ORAL_TABLET | Freq: Every day | ORAL | Status: DC
  Administered 2023-03-24 – 2023-03-26 (×3): 20 mg via ORAL
  Filled 2023-03-23 (×3): qty 2

## 2023-03-23 MED ORDER — SODIUM CHLORIDE 0.9 % IV SOLN
1.0000 g | INTRAVENOUS | Status: DC
  Administered 2023-03-24 – 2023-03-25 (×2): 1 g via INTRAVENOUS
  Filled 2023-03-23 (×4): qty 10

## 2023-03-23 MED ORDER — EZETIMIBE 10 MG PO TABS
10.0000 mg | ORAL_TABLET | Freq: Every day | ORAL | Status: DC
  Administered 2023-03-24 – 2023-03-26 (×3): 10 mg via ORAL
  Filled 2023-03-23 (×3): qty 1

## 2023-03-23 MED ORDER — POLYETHYLENE GLYCOL 3350 17 G PO PACK: 17.0000 g | PACK | Freq: Every day | ORAL | Status: DC | PRN

## 2023-03-23 MED ORDER — ACETAMINOPHEN 325 MG PO TABS: 650.0000 mg | ORAL_TABLET | Freq: Four times a day (QID) | ORAL | Status: DC | PRN

## 2023-03-23 MED ORDER — PREDNISONE 50 MG PO TABS
50.0000 mg | ORAL_TABLET | Freq: Every day | ORAL | Status: DC
  Administered 2023-03-24 – 2023-03-25 (×2): 50 mg via ORAL
  Filled 2023-03-23 (×2): qty 1

## 2023-03-23 MED ORDER — CLONAZEPAM 0.5 MG PO TABS
0.5000 mg | ORAL_TABLET | Freq: Two times a day (BID) | ORAL | Status: DC | PRN
  Administered 2023-03-24 – 2023-03-26 (×4): 0.5 mg via ORAL
  Filled 2023-03-23 (×4): qty 1

## 2023-03-23 MED ORDER — SODIUM CHLORIDE 0.9 % IV SOLN
2.0000 g | Freq: Once | INTRAVENOUS | Status: AC
  Administered 2023-03-23: 2 g via INTRAVENOUS
  Filled 2023-03-23: qty 20

## 2023-03-23 MED ORDER — CLOPIDOGREL BISULFATE 75 MG PO TABS
75.0000 mg | ORAL_TABLET | Freq: Every day | ORAL | Status: DC
  Administered 2023-03-24 – 2023-03-26 (×3): 75 mg via ORAL
  Filled 2023-03-23 (×3): qty 1

## 2023-03-23 MED ORDER — IPRATROPIUM-ALBUTEROL 0.5-2.5 (3) MG/3ML IN SOLN
3.0000 mL | RESPIRATORY_TRACT | Status: DC
  Administered 2023-03-23 – 2023-03-24 (×7): 3 mL via RESPIRATORY_TRACT
  Filled 2023-03-23 (×7): qty 3

## 2023-03-23 MED ORDER — BUDESONIDE 0.25 MG/2ML IN SUSP
0.2500 mg | Freq: Two times a day (BID) | RESPIRATORY_TRACT | Status: DC
  Administered 2023-03-23 – 2023-03-26 (×6): 0.25 mg via RESPIRATORY_TRACT
  Filled 2023-03-23 (×6): qty 2

## 2023-03-23 MED ORDER — ASPIRIN 81 MG PO TBEC
81.0000 mg | DELAYED_RELEASE_TABLET | Freq: Every day | ORAL | Status: DC
  Administered 2023-03-24 – 2023-03-26 (×3): 81 mg via ORAL
  Filled 2023-03-23 (×3): qty 1

## 2023-03-23 MED ORDER — OMEGA-3-ACID ETHYL ESTERS 1 G PO CAPS
1.0000 g | ORAL_CAPSULE | Freq: Every day | ORAL | Status: DC
  Administered 2023-03-24 – 2023-03-26 (×3): 1 g via ORAL
  Filled 2023-03-23 (×3): qty 1

## 2023-03-23 MED ORDER — HALOPERIDOL LACTATE 5 MG/ML IJ SOLN
2.0000 mg | Freq: Once | INTRAMUSCULAR | Status: AC
  Administered 2023-03-23: 2 mg via INTRAMUSCULAR
  Filled 2023-03-23: qty 1

## 2023-03-23 MED ORDER — MELATONIN 5 MG PO TABS
5.0000 mg | ORAL_TABLET | Freq: Every evening | ORAL | Status: DC | PRN
  Administered 2023-03-25: 5 mg via ORAL
  Filled 2023-03-23 (×2): qty 1

## 2023-03-23 MED ORDER — POTASSIUM CHLORIDE 20 MEQ PO PACK
40.0000 meq | PACK | ORAL | Status: AC
  Administered 2023-03-23 (×2): 40 meq via ORAL
  Filled 2023-03-23 (×2): qty 2

## 2023-03-23 NOTE — Assessment & Plan Note (Signed)
Tick was removed by patient on March 11, 2023.  It was presumably on the patient's body for at least 24 hours per patient report.  At this time no rash.  We will monitor the patient clinically.  I will cover patient's pneumonia for atypical pathogens with doxycycline

## 2023-03-23 NOTE — Progress Notes (Signed)
Patient confused and agitated pulled iv out of arm.  Refused re stick.  Bed alarm on, safe environment maintained

## 2023-03-23 NOTE — Progress Notes (Signed)
At bedside for PIV start as ordered, pt. Declined PIV access, RN at bedside.

## 2023-03-23 NOTE — Assessment & Plan Note (Signed)
Pressure is rather soft this evening, resume antihypertensive agents in the morning.

## 2023-03-23 NOTE — Assessment & Plan Note (Signed)
Pending respiratory viral panel.  Treat with doxycycline and ceftriaxone.  Cultures have been sent from the ER.  Please follow-up same.

## 2023-03-23 NOTE — ED Notes (Signed)
RT note: Patient placed on 4L [place] due to decreasing oxygen saturations to 80% room air. Initially she was placed on  3lpm [place] and her oxygen saturation was only 89%

## 2023-03-23 NOTE — H&P (Addendum)
History and Physical    Patient: Leslie Dorsey ZOX:[id number] DOB: 07/03/1951 DOA: 03/23/2023 DOS: the patient was seen and examined on 03/23/2023 PCP: Farris Has, MD  Patient coming from: Home  Chief Complaint:  Chief Complaint  Patient presents with   Shortness of Breath   HPI: [name] is a 72 y.o. female with medical history significant of asthma.  Patient however typically has well-controlled symptoms, for which she uses as needed inhaler therapy.  And patient reports a rather excellent exercise tolerance such as walking several 100 feet to get to her mailbox, taking care of her 5 acres land, she lives [place] a ranch without the most 4 steps that she is able to go up and down without any chest pain or shortness of breath or presyncope.  Patient reports that about 10 days ago she was discovered to have take on the left thigh that was presumably on there for more than 24 hours.  At the time the tick was removed.  Patient has had no rash or joint swelling since then.  No fever.  Patient was okay till 6 days ago when she reports a new onset cough that is wet with very scant sputum production clear yellow.  Associated with sensation of chest tightness and shortness of breath.  There is no chest pain no presyncope no palpitation no fever no leg swelling.  Patient came to the ER at [facility] due to severe cough and shortness of breath and fatigue.  Patient was found to be hypoxic at outside facility, s/p ceftriaxone and azithromycin methylprednisolone and inhaled bronchodilator therapy.  Patient is transferred to room 1/503 at [facility].  Medical evaluation is sought.  At this time patient is having ongoing symptoms of cough and chest tightness.  Medical evaluation is sought Review of Systems: As mentioned in the history of present illness. All other systems reviewed and are negative. Past Medical History:  Diagnosis Date   CAD (coronary artery disease)    a. Inf  STEMI 08/2015 - inferior STEMI 08/2015 s/p DES to RCA.   Essential hypertension    Hyperglycemia    a. 08/2015: A1C 5.8.   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 (HCC)    Tobacco abuse    Past Surgical History:  Procedure Laterality Date   ABDOMINAL HYSTERECTOMY     CARDIAC CATHETERIZATION N/A 08/26/2015   Procedure: Left Heart Cath and Coronary Angiography;  Surgeon: Lennette Bihari, MD;  Location: [facility] INVASIVE CV LAB;  Service: Cardiovascular;  Laterality: N/A;   CARDIAC CATHETERIZATION N/A 08/26/2015   Procedure: Coronary Stent Intervention;  Surgeon: Lennette Bihari, MD;  Location: [facility] INVASIVE CV LAB;  Service: Cardiovascular;  Laterality: N/A;   HEMORROIDECTOMY     Social History:  reports that she quit smoking about 7 years ago. Her smoking use included cigarettes. She has never used smokeless tobacco. She reports that she does not drink alcohol and does not use drugs.  Allergies  Allergen Reactions   Brilinta [Ticagrelor] Shortness Of Breath and Other (See Comments)    Difficulty breathing    Levofloxacin Anaphylaxis, Hives and Rash   Morphine And Codeine Anaphylaxis   Shellfish-Derived Products Shortness Of Breath and Other (See Comments)    Crabmeat- "Made me feel like I was dying"   Gabapentin Other (See Comments)    Does not care to take this   Lisinopril Hives   Lipitor [Atorvastatin] Other (See Comments)  Myalgia     Family History  Problem Relation Age of Onset   Heart disease Mother    Heart attack Mother     Prior to Admission medications   Medication Sig Start Date End Date Taking? Authorizing Provider  albuterol (PROVENTIL HFA;VENTOLIN HFA) 108 (90 Base) MCG/ACT inhaler Inhale 2 puffs into the lungs every 3 (three) hours as needed for wheezing or shortness of breath.   Yes [provider]  amLODipine (NORVASC) 2.5 MG tablet Take 1 tablet (2.5 mg total) by mouth daily. 01/08/20  Yes  Azalee Course, PA  aspirin EC 81 MG EC tablet Take 1 tablet (81 mg total) by mouth daily. 08/28/15  Yes Azalee Course, PA  clonazePAM (KLONOPIN) 1 MG tablet Take 0.5-1 mg by mouth 2 (two) times daily as needed for anxiety. 08/12/15  Yes [provider]  clopidogrel (PLAVIX) 75 MG tablet TAKE 1 TABLET BY MOUTH EVERY DAY Patient taking differently: Take 75 mg by mouth in the morning. 04/21/20  Yes Lennette Bihari, MD  ezetimibe (ZETIA) 10 MG tablet TAKE 1 TABLET BY MOUTH EVERY DAY 02/17/23  Yes O'Neal, Ronnald Ramp, MD  hydrochlorothiazide (HYDRODIURIL) 25 MG tablet Take 25 mg by mouth every morning. 06/05/19  Yes [provider]  ibuprofen (ADVIL) 800 MG tablet Take 1 tablet (800 mg total) by mouth 3 (three) times daily. Patient taking differently: Take 800 mg by mouth every 8 (eight) hours as needed for mild pain or headache. 07/22/21  Yes O'Neal, Ronnald Ramp, MD  nitroGLYCERIN (NITROSTAT) 0.4 MG SL tablet PLACE 1 TABLET UNDER TONGUE EVERY 5 MINUTES FOR 3 DOSES AS NEEDED FOR CHEST PAIN Patient taking differently: Place 0.4 mg under the tongue every 5 (five) minutes x 3 doses as needed for chest pain. 04/29/20  Yes Lennette Bihari, MD  OMEGA-3 FATTY ACIDS PO Take 800 mg by mouth daily.   Yes [provider]  oxyCODONE-acetaminophen (PERCOCET) 10-325 MG tablet Take 0.25-1 tablets by mouth every 6 (six) hours as needed for pain. 06/12/19  Yes [provider]  rosuvastatin (CRESTOR) 20 MG tablet Take 0.5 tablets (10 mg total) by mouth daily. Patient taking differently: Take 20 mg by mouth daily. 01/08/20  Yes Azalee Course, PA  losartan (COZAAR) 100 MG tablet Take 1 tablet (100 mg total) by mouth daily. Patient not taking: Reported on 03/23/2023 01/08/20   Azalee Course, PA  nicotine (NICODERM CQ - DOSED IN MG/24 HOURS) 21 mg/24hr patch PLEASE SEE ATTACHED FOR DETAILED DIRECTIONS Patient not taking: Reported on 03/23/2023 04/21/20   Croitoru, Mihai, MD  potassium chloride SA (KLOR-CON) 20  MEQ tablet Take 1 tablet (20 mEq total) by mouth daily. Patient not taking: Reported on 03/23/2023 07/22/21   Sande Rives, MD    Physical Exam: Vitals:   03/23/23 1233 03/23/23 1353 03/23/23 1430 03/23/23 1544  BP:   100/76 (!) 97/59  Pulse:   (!) 44 96  Resp:    19  Temp:    98.9 F (37.2 C)  TempSrc:    Oral  SpO2: 97% 96% (!) 78% 97%  Weight:      Height:       Patient on 4 L/min of supplementary oxygen, does not appear to be distressed at this time.  Son and daughter at bedside.  Patient gives a history himself, coherent Respiratory exam: Diffuse inspiratory crackles, occasional expiratory wheezes, patient is coughing. Cardiovascular exam S1-S2 normal Abdomen soft nontender Extremities warm without edema Skin there is no rash at  site of previous tick bite on the left thigh.  Advised patient to check it at least daily and report any development of rash. Data Reviewed:  Labs on Admission:  Results for orders placed or performed during the hospital encounter of 03/23/23 (from the past 24 hour(s))  CBC with Differential     Status: Abnormal   Collection Time: 03/23/23 12:41 PM  Result Value Ref Range   WBC 11.8 (H) 4.0 - 10.5 K/uL   RBC 4.56 3.87 - 5.11 MIL/uL   Hemoglobin 13.7 12.0 - 15.0 g/dL   HCT 82.9 56.2 - 13.0 %   MCV 88.2 80.0 - 100.0 fL   MCH 30.0 26.0 - 34.0 pg   MCHC 34.1 30.0 - 36.0 g/dL   RDW 86.5 78.4 - 69.6 %   Platelets 295 150 - 400 K/uL   nRBC 0.0 0.0 - 0.2 %   Neutrophils Relative % 80 %   Neutro Abs 9.5 (H) 1.7 - 7.7 K/uL   Lymphocytes Relative 12 %   Lymphs Abs 1.4 0.7 - 4.0 K/uL   Monocytes Relative 7 %   Monocytes Absolute 0.9 0.1 - 1.0 K/uL   Eosinophils Relative 0 %   Eosinophils Absolute 0.0 0.0 - 0.5 K/uL   Basophils Relative 0 %   Basophils Absolute 0.1 0.0 - 0.1 K/uL   Immature Granulocytes 1 %   Abs Immature Granulocytes 0.06 0.00 - 0.07 K/uL  Basic metabolic panel     Status: Abnormal   Collection Time: 03/23/23 12:41 PM   Result Value Ref Range   Sodium 139 135 - 145 mmol/L   Potassium 3.0 (L) 3.5 - 5.1 mmol/L   Chloride 94 (L) 98 - 111 mmol/L   CO2 33 (H) 22 - 32 mmol/L   Glucose, Bld 104 (H) 70 - 99 mg/dL   BUN 22 8 - 23 mg/dL   Creatinine, Ser 2.95 0.44 - 1.00 mg/dL   Calcium 8.8 (L) 8.9 - 10.3 mg/dL   GFR, Estimated >28 >41 mL/min   Anion gap 12 5 - 15  Brain natriuretic peptide     Status: Abnormal   Collection Time: 03/23/23 12:41 PM  Result Value Ref Range   B Natriuretic Peptide 229.4 (H) 0.0 - 100.0 pg/mL  Troponin I (High Sensitivity)     Status: None   Collection Time: 03/23/23 12:41 PM  Result Value Ref Range   Troponin I (High Sensitivity) 11 <18 ng/L  SARS Coronavirus 2 by RT PCR (hospital order, performed in [facility] hospital lab) *cepheid single result test* Anterior Nasal Swab     Status: None   Collection Time: 03/23/23  2:27 PM   Specimen: Anterior Nasal Swab  Result Value Ref Range   SARS Coronavirus 2 by RT PCR NEGATIVE NEGATIVE  Lactic acid, plasma     Status: None   Collection Time: 03/23/23  2:39 PM  Result Value Ref Range   Lactic Acid, Venous 1.1 0.5 - 1.9 mmol/L   Basic Metabolic Panel: Recent Labs  Lab 03/23/23 1241  NA 139  K 3.0*  CL 94*  CO2 33*  GLUCOSE 104*  BUN 22  CREATININE 0.84  CALCIUM 8.8*   Liver Function Tests: No results for input(s): "AST", "ALT", "ALKPHOS", "BILITOT", "PROT", "ALBUMIN" in the last 168 hours. No results for input(s): "LIPASE", "AMYLASE" in the last 168 hours. No results for input(s): "AMMONIA" in the last 168 hours. CBC: Recent Labs  Lab 03/23/23 1241  WBC 11.8*  NEUTROABS 9.5*  HGB 13.7  HCT 40.2  MCV 88.2  PLT 295   Cardiac Enzymes: Recent Labs  Lab 03/23/23 1241  TROPONINIHS 11    BNP (last 3 results) No results for input(s): "PROBNP" in the last 8760 hours. CBG: No results for input(s): "GLUCAP" in the last 168 hours.  Radiological Exams on Admission:  DG Chest Port 1 View  Result Date:  03/23/2023 CLINICAL DATA:  sob EXAM: PORTABLE CHEST 1 VIEW COMPARISON:  07/17/2021. FINDINGS: There are new reticulonodular opacities throughout bilateral lungs with lower lobe predominance, which may represent diffuse process such as interstitial lung disease, pulmonary edema, etc. Correlate clinically. No dense consolidation, major atelectasis or pneumothorax. Bilateral lateral costophrenic angles are clear. Normal cardio-mediastinal silhouette. No acute osseous abnormalities. The soft tissues are within normal limits. IMPRESSION: *New reticulonodular opacities throughout bilateral lungs with lower lobe predominance, which may represent diffuse process such as interstitial lung disease, pulmonary edema, etc. Electronically Signed   By: Jules Schick M.D.   On: 03/23/2023 13:41    EKG: Independently reviewed. NSR , near tachycardai.   Assessment and Plan: * CAP (community acquired pneumonia) Pending respiratory viral panel.  Treat with doxycycline and ceftriaxone.  Cultures have been sent from the ER.  Please follow-up same.  Asthma exacerbation A/w new hypoxia on 4lpm oxgyen..  Elevated bicarb level.  However I do not think patient needs an ABG right now given that she is not in distress.  We will treat the patient with continued prednisone 60 mg daily, DuoNebs and inhaled steroid therapy.  This is likely been precipitated by infection, pneumonia  Tick bite Tick was removed by patient on March 11, 2023.  It was presumably on the patient's body for at least 24 hours per patient report.  At this time no rash.  We will monitor the patient clinically.  I will cover patient's pneumonia for atypical pathogens with doxycycline  HTN (hypertension) Pressure is rather soft this evening, resume antihypertensive agents in the morning.      Advance Care Planning:   Code Status: Prior full code  Consults: none  Family Communication: at bedside. All questions answered.   Severity of Illness: The  appropriate patient status for this patient is INPATIENT. Inpatient status is judged to be reasonable and necessary in order to provide the required intensity of service to ensure the patient's safety. The patient's presenting symptoms, physical exam findings, and initial radiographic and laboratory data in the context of their chronic comorbidities is felt to place them at high risk for further clinical deterioration. Furthermore, it is not anticipated that the patient will be medically stable for discharge from the hospital within 2 midnights of admission.   * I certify that at the point of admission it is my clinical judgment that the patient will require inpatient hospital care spanning beyond 2 midnights from the point of admission due to high intensity of service, high risk for further deterioration and high frequency of surveillance required.*  Author: Nolberto Hanlon, MD 03/23/2023 4:36 PM  For on call review [email or web address].

## 2023-03-23 NOTE — Assessment & Plan Note (Addendum)
A/w new hypoxia on 4lpm oxgyen..  Elevated bicarb level.  However I do not think patient needs an ABG right now given that she is not in distress.  We will treat the patient with continued prednisone 60 mg daily, DuoNebs and inhaled steroid therapy.  This is likely been precipitated by infection, pneumonia

## 2023-03-23 NOTE — ED Notes (Signed)
Rad at the bedside for CXR.

## 2023-03-23 NOTE — ED Triage Notes (Signed)
Patient arrives in wheelchair by POV sent by PCP for low oxygen saturations. Patient initial room air sat 85%. C/o shortness of breath for the past 6 days. Patient states she has not been able to smoke for the past week due to shortness of breath. States she had gotten down to half a pack a day. C/o bilateral rib cage pain from coughing.

## 2023-03-23 NOTE — ED Provider Notes (Signed)
[place] EMERGENCY DEPARTMENT AT [facility] Provider Note   CSN: [id number] Arrival date & time: 03/23/23  1154     History  Chief Complaint  Patient presents with   Shortness of Breath    Leslie Dorsey is a 72 y.o. female.  72 yo F with a chief complaint of difficulty breathing.  This been going on for about 6 days.  She went to urgent care today and was found to be hypoxic into the low 80s and then was sent to the emergency department for further evaluation.  Requiring 4 L of oxygen here.  She has a history of smoking she stopped 6 days ago when she got sick.  Is coughing quite a bit but is nonproductive.  She denies any sick contacts.  Denies chest pain or pressure.   Shortness of Breath      Home Medications Prior to Admission medications   Medication Sig Start Date End Date Taking? Authorizing Provider  albuterol (PROVENTIL HFA;VENTOLIN HFA) 108 (90 Base) MCG/ACT inhaler Inhale 1-2 puffs into the lungs every 6 (six) hours as needed for wheezing or shortness of breath.    [provider]  amLODipine (NORVASC) 2.5 MG tablet Take 1 tablet (2.5 mg total) by mouth daily. 01/08/20   Azalee Course, PA  aspirin EC 81 MG EC tablet Take 1 tablet (81 mg total) by mouth daily. 08/28/15   Azalee Course, PA  Cholecalciferol (VITAMIN D3) 125 MCG (5000 UT) CAPS Take 5,000 Units by mouth every other day.    [provider]  clonazePAM (KLONOPIN) 1 MG tablet Take 1 mg by mouth daily as needed for anxiety.  08/12/15   [provider]  clopidogrel (PLAVIX) 75 MG tablet TAKE 1 TABLET BY MOUTH EVERY DAY 04/21/20   Lennette Bihari, MD  Coenzyme Q10 (COQ-10 PO) Take 200 mg by mouth daily.    [provider]  cyclobenzaprine (FLEXERIL) 10 MG tablet Take 5 mg by mouth daily as needed for muscle spasms.  05/28/15   [provider]  diclofenac Sodium (VOLTAREN) 1 % GEL Apply 4 g topically 4 (four) times daily. 07/17/21   [provider]   ezetimibe (ZETIA) 10 MG tablet TAKE 1 TABLET BY MOUTH EVERY DAY 02/17/23   O'Neal, Ronnald Ramp, MD  ferrous sulfate 325 (65 FE) MG tablet 2 tablets    [provider]  hydrochlorothiazide (HYDRODIURIL) 25 MG tablet Take 25 mg by mouth every morning. 06/05/19   [provider]  ibuprofen (ADVIL) 800 MG tablet Take 1 tablet (800 mg total) by mouth 3 (three) times daily. 07/22/21   O'NealRonnald Ramp, MD  loratadine (CLARITIN) 10 MG tablet Take 10 mg by mouth daily as needed for allergies.    [provider]  losartan (COZAAR) 100 MG tablet Take 1 tablet (100 mg total) by mouth daily. Patient not taking: Reported on 07/22/2021 01/08/20   Azalee Course, PA  nicotine (NICODERM CQ - DOSED IN MG/24 HOURS) 21 mg/24hr patch PLEASE SEE ATTACHED FOR DETAILED DIRECTIONS 04/21/20   Croitoru, Mihai, MD  nitroGLYCERIN (NITROSTAT) 0.4 MG SL tablet PLACE 1 TABLET UNDER TONGUE EVERY 5 MINUTES FOR 3 DOSES AS NEEDED FOR CHEST PAIN 04/29/20   Lennette Bihari, MD  OMEGA-3 FATTY ACIDS PO Take 800 mg by mouth daily.    [provider]  oxyCODONE-acetaminophen (PERCOCET) 10-325 MG tablet TAKE 1/4 TABLET FOUR TIMES A DAY AS NEEDED 06/12/19   [provider]  potassium chloride SA (KLOR-CON) 20  MEQ tablet Take 1 tablet (20 mEq total) by mouth daily. 07/22/21   O'NealRonnald Ramp, MD  rosuvastatin (CRESTOR) 20 MG tablet Take 0.5 tablets (10 mg total) by mouth daily. 01/08/20   Azalee Course, PA      Allergies    Brilinta [ticagrelor], Levofloxacin, Morphine and codeine, and Lipitor [atorvastatin]    Review of Systems   Review of Systems  Respiratory:  Positive for shortness of breath.     Physical Exam Updated Vital Signs BP 121/77   Pulse 92   Temp 97.9 F (36.6 C) (Oral)   Resp (!) 22   Ht 5\' 5"  (1.651 m)   Wt 77.6 kg   SpO2 96%   BMI 28.46 kg/m  Physical Exam Vitals and nursing note reviewed.  Constitutional:      General: She is not in acute distress.     Appearance: She is well-developed. She is not diaphoretic.  HENT:     Head: Normocephalic and atraumatic.  Eyes:     Pupils: Pupils are equal, round, and reactive to light.  Cardiovascular:     Rate and Rhythm: Normal rate and regular rhythm.     Heart sounds: No murmur heard.    No friction rub. No gallop.  Pulmonary:     Effort: Pulmonary effort is normal.     Breath sounds: Rhonchi present. No wheezing or rales.     Comments: Diffuse rhonchi.  Mildly long expiratory effort. Abdominal:     General: There is no distension.     Palpations: Abdomen is soft.     Tenderness: There is no abdominal tenderness.  Musculoskeletal:        General: No tenderness.     Cervical back: Normal range of motion and neck supple.  Skin:    General: Skin is warm and dry.  Neurological:     Mental Status: She is alert and oriented to person, place, and time.  Psychiatric:        Behavior: Behavior normal.     ED Results / Procedures / Treatments   Labs (all labs ordered are listed, but only abnormal results are displayed) Labs Reviewed  CBC WITH DIFFERENTIAL/PLATELET - Abnormal; Notable for the following components:      Result Value   WBC 11.8 (*)    Neutro Abs 9.5 (*)    All other components within normal limits  BASIC METABOLIC PANEL - Abnormal; Notable for the following components:   Potassium 3.0 (*)    Chloride 94 (*)    CO2 33 (*)    Glucose, Bld 104 (*)    Calcium 8.8 (*)    All other components within normal limits  BRAIN NATRIURETIC PEPTIDE - Abnormal; Notable for the following components:   B Natriuretic Peptide 229.4 (*)    All other components within normal limits  CULTURE, BLOOD (ROUTINE X 2)  CULTURE, BLOOD (ROUTINE X 2)  SARS CORONAVIRUS 2 BY RT PCR  LACTIC ACID, PLASMA  LACTIC ACID, PLASMA  TROPONIN I (HIGH SENSITIVITY)    EKG EKG Interpretation Date/Time:  Wednesday March 23 2023 12:51:44 EDT Ventricular Rate:  96 PR Interval:  133 QRS Duration:  88 QT  Interval:  365 QTC Calculation: 462 R Axis:   74  Text Interpretation: Sinus rhythm Right atrial enlargement No significant change since last tracing Confirmed by Melene Plan [phone]) on 03/23/2023 2:02:27 PM  Radiology DG Chest Port 1 View  Result Date: 03/23/2023 CLINICAL DATA:  sob EXAM: PORTABLE CHEST 1 VIEW  COMPARISON:  07/17/2021. FINDINGS: There are new reticulonodular opacities throughout bilateral lungs with lower lobe predominance, which may represent diffuse process such as interstitial lung disease, pulmonary edema, etc. Correlate clinically. No dense consolidation, major atelectasis or pneumothorax. Bilateral lateral costophrenic angles are clear. Normal cardio-mediastinal silhouette. No acute osseous abnormalities. The soft tissues are within normal limits. IMPRESSION: *New reticulonodular opacities throughout bilateral lungs with lower lobe predominance, which may represent diffuse process such as interstitial lung disease, pulmonary edema, etc. Electronically Signed   By: Jules Schick M.D.   On: 03/23/2023 13:41    Procedures .Critical Care  Performed by: Melene Plan, DO Authorized by: Melene Plan, DO   Critical care provider statement:    Critical care time (minutes):  35   Critical care time was exclusive of:  Separately billable procedures and treating other patients   Critical care was time spent personally by me on the following activities:  Development of treatment plan with patient or surrogate, discussions with consultants, evaluation of patient's response to treatment, examination of patient, ordering and review of laboratory studies, ordering and review of radiographic studies, ordering and performing treatments and interventions, pulse oximetry, re-evaluation of patient's condition and review of old charts   Care discussed with: admitting provider       Medications Ordered in ED Medications  cefTRIAXone (ROCEPHIN) 2 g in sodium chloride 0.9 % 100 mL IVPB (2 g  Intravenous New Bag/Given 03/23/23 1408)  azithromycin (ZITHROMAX) 500 mg in sodium chloride 0.9 % 250 mL IVPB (has no administration in time range)  ipratropium-albuterol (DUONEB) 0.5-2.5 (3) MG/3ML nebulizer solution 3 mL (3 mLs Nebulization Given 03/23/23 1233)  ipratropium-albuterol (DUONEB) 0.5-2.5 (3) MG/3ML nebulizer solution 3 mL (3 mLs Nebulization Given 03/23/23 1351)  methylPREDNISolone sodium succinate (SOLU-MEDROL) 125 mg/2 mL injection 125 mg (125 mg Intravenous Given 03/23/23 1406)    ED Course/ Medical Decision Making/ A&P                             Medical Decision Making Amount and/or Complexity of Data Reviewed Labs: ordered. Radiology: ordered. ECG/medicine tests: ordered.  Risk Prescription drug management. Decision regarding hospitalization.   72 yo F with a chief complaint of difficulty breathing.  This has been going on for almost a week now.  Patient is hypoxic with tachypnea rhonchi.  Likely pneumonia by history and physical.  Chest x-ray independently interpreted by me with concern for seen obscuration of the right hemidiaphragm.  Will start on antibiotic therapy.  Possibly also a component of COPD.  Will give another DuoNeb and steroids.  Will discuss with medicine for admission.  The patients results and plan were reviewed and discussed.   Any x-rays performed were independently reviewed by myself.   Differential diagnosis were considered with the presenting HPI.  Medications  cefTRIAXone (ROCEPHIN) 2 g in sodium chloride 0.9 % 100 mL IVPB (2 g Intravenous New Bag/Given 03/23/23 1408)  azithromycin (ZITHROMAX) 500 mg in sodium chloride 0.9 % 250 mL IVPB (has no administration in time range)  ipratropium-albuterol (DUONEB) 0.5-2.5 (3) MG/3ML nebulizer solution 3 mL (3 mLs Nebulization Given 03/23/23 1233)  ipratropium-albuterol (DUONEB) 0.5-2.5 (3) MG/3ML nebulizer solution 3 mL (3 mLs Nebulization Given 03/23/23 1351)  methylPREDNISolone sodium succinate  (SOLU-MEDROL) 125 mg/2 mL injection 125 mg (125 mg Intravenous Given 03/23/23 1406)    Vitals:   03/23/23 1202 03/23/23 1203 03/23/23 1233 03/23/23 1353  BP:  121/77    Pulse:  92    Resp:  (!) 22    Temp:  97.9 F (36.6 C)    TempSrc:  Oral    SpO2: (!) 85% 92% 97% 96%  Weight: 77.6 kg     Height: 5\' 5"  (1.651 m)       Final diagnoses:  Acute respiratory failure with hypoxia (HCC)    Admission/ observation were discussed with the admitting physician, patient and/or family and they are comfortable with the plan.          Final Clinical Impression(s) / ED Diagnoses Final diagnoses:  Acute respiratory failure with hypoxia [facility])    Rx / DC Orders ED Discharge Orders     None         Melene Plan, DO 03/23/23 1430

## 2023-03-24 DIAGNOSIS — A419 Sepsis, unspecified organism: Secondary | ICD-10-CM | POA: Insufficient documentation

## 2023-03-24 DIAGNOSIS — J45901 Unspecified asthma with (acute) exacerbation: Secondary | ICD-10-CM

## 2023-03-24 DIAGNOSIS — J9601 Acute respiratory failure with hypoxia: Secondary | ICD-10-CM | POA: Diagnosis not present

## 2023-03-24 DIAGNOSIS — J189 Pneumonia, unspecified organism: Secondary | ICD-10-CM

## 2023-03-24 LAB — RESPIRATORY PANEL BY PCR

## 2023-03-24 LAB — RAPID URINE DRUG SCREEN, HOSP PERFORMED
Amphetamines: NOT DETECTED
Barbiturates: NOT DETECTED
Benzodiazepines: NOT DETECTED
Cocaine: NOT DETECTED
Opiates: NOT DETECTED
Tetrahydrocannabinol: NOT DETECTED

## 2023-03-24 LAB — BASIC METABOLIC PANEL
Anion gap: 11 (ref 5–15)
BUN: 21 mg/dL (ref 8–23)
CO2: 29 mmol/L (ref 22–32)
Calcium: 8 mg/dL — ABNORMAL LOW (ref 8.9–10.3)
Chloride: 98 mmol/L (ref 98–111)
Creatinine, Ser: 0.93 mg/dL (ref 0.44–1.00)
GFR, Estimated: >60 mL/min (ref >60)
Glucose, Bld: 178 mg/dL — ABNORMAL HIGH (ref 70–99)
Potassium: 3.4 mmol/L — ABNORMAL LOW (ref 3.5–5.1)
Sodium: 138 mmol/L (ref 135–145)

## 2023-03-24 LAB — APTT: aPTT: 29 seconds (ref 24–36)

## 2023-03-24 LAB — PROTIME-INR
INR: 1.1 (ref 0.8–1.2)
Prothrombin Time: 13.9 seconds (ref 11.4–15.2)

## 2023-03-24 LAB — ETHANOL: Alcohol, Ethyl (B): <10 mg/dL (ref <10)

## 2023-03-24 MED ORDER — POTASSIUM CHLORIDE CRYS ER 20 MEQ PO TBCR
40.0000 meq | EXTENDED_RELEASE_TABLET | Freq: Once | ORAL | Status: AC
  Administered 2023-03-24: 40 meq via ORAL
  Filled 2023-03-24: qty 2

## 2023-03-24 MED ORDER — IPRATROPIUM-ALBUTEROL 0.5-2.5 (3) MG/3ML IN SOLN
3.0000 mL | Freq: Three times a day (TID) | RESPIRATORY_TRACT | Status: DC
  Administered 2023-03-25 – 2023-03-26 (×4): 3 mL via RESPIRATORY_TRACT
  Filled 2023-03-24 (×4): qty 3

## 2023-03-24 NOTE — Progress Notes (Addendum)
    Patient Name: Monaye Blackie           DOB: [birth year]  MRN: [id number]      Admission Date: 03/23/2023  Attending Provider: Nolberto Hanlon, MD  Primary Diagnosis: CAP (community acquired pneumonia)   Level of care: Med-Surg    CROSS COVER NOTE   Date of Service   03/24/2023   Kimorah Ridolfi, 72 y.o. female, was admitted on 03/23/2023 for CAP (community acquired pneumonia).    HPI/Events of Note   New onset agitation and confusion Currently removing medical equipment, has pulled out IV.  Unfortunately, she is not following commands despite multiple attempts at redirection by staff. Hemodynamically stable, oxygenating well.  No focal neuro deficits.   Post- haldol-->  resting well, intermittently confused   Interventions/ Plan   Will trial IM haldol for agitation.  Melatonin has also been ordered to assist with sleep cycle. Labs-->  VBG, ethanol, drug screen Delirium precautions         Anthoney Harada, DNP, ACNPC- AG [facility] [place]

## 2023-03-24 NOTE — Progress Notes (Signed)
  Progress Note   Patient: Leslie Dorsey NWG:[id number] DOB: 07-Jan-1951 DOA: 03/23/2023     1 DOS: the patient was seen and examined on 03/24/2023   Brief hospital course: 72 year old woman PMH including asthma, smoker, presented with cough and shortness of breath.  Found to have acute hypoxic respiratory failure.  Also reported tick bite approximately 10 days ago.  Consultants  Procedures  Assessment and Plan: Sepsis secondary to CAP (community acquired pneumonia) Acute hypoxic respiratory failure Elevated WBC and RR on admission. 85% on RA per ED triage w/ SOB. CXR with new reticulonodular opacities throughout bilateral lungs with lower lobe predominance. DDx includes interstitial lung disease, edema. BNP only modestly elevated, echo frmo 2018 w/ normal LVEF, grade 1 diastolic dysfunction. Covid negative, RVP negative. BC pending. Treat with doxycycline and ceftriaxone.   Remains short of breath and hypoxic on 4 L. Check CMP in AM  Acute delirium Acute metabolic encephalopathy UDS negative Wonder about underlying cognitive impairment.  Alternatively may be steroid-induced. Currently better  Asthma exacerbation Smoker 1/2 ppd Patient reports she will quit smoking.  Counseled to quit smoking. Asthma improved, continue bronchodilators as needed.  Prednisone taper.  Tick bite Tick was removed by patient on March 11, 2023.  No rash.  Monitor clinically.  Essential hypertension Blood pressure stable.  Holding antihypertensives.    Subjective:  Per overnight: new onset agitation and confusion, pulled out IV, treated w/ Haldol.  Better today Still SOB  Physical Exam: Vitals:   03/24/23 0728 03/24/23 1211 03/24/23 1224 03/24/23 1606  BP:   110/63   Pulse:   78   Resp:   18   Temp:      TempSrc:      SpO2: 94% 93% 97% 96%  Weight:      Height:       Physical Exam Vitals reviewed.  Constitutional:      General: She is not in acute distress.    Appearance: She  is not ill-appearing or toxic-appearing.  Cardiovascular:     Rate and Rhythm: Normal rate and regular rhythm.     Heart sounds: No murmur heard. Pulmonary:     Effort: No respiratory distress.     Breath sounds: Wheezing and rhonchi present. No rales.  Neurological:     Mental Status: She is alert.  Psychiatric:        Mood and Affect: Mood normal.        Behavior: Behavior normal.     Data Reviewed: AFVSS 94% on 3L K+ 3.0 > 3.4 VBG noted Lactic acid WNL WBC 11.5, no significant change  EKG SR  Family Communication: daughter-in-law at bedside  Disposition: Status is: Inpatient Remains inpatient appropriate because: hypoxi     Time spent: 30 minutes  Author: Brendia Sacks, MD 03/24/2023 4:17 PM  For on call review [email or web address].

## 2023-03-24 NOTE — Progress Notes (Signed)
At bedside for the 2nd time as consulted to start PIV. RN at bedside at explained to patient the need of PIV, pt. Still declined.

## 2023-03-24 NOTE — Hospital Course (Addendum)
72 year old woman PMH including asthma, smoker, presented with cough and shortness of breath.  Found to have acute hypoxic respiratory failure.  Also reported tick bite approximately 10 days ago.  Consultants  Procedures

## 2023-03-25 DIAGNOSIS — J9601 Acute respiratory failure with hypoxia: Secondary | ICD-10-CM | POA: Diagnosis not present

## 2023-03-25 DIAGNOSIS — J189 Pneumonia, unspecified organism: Secondary | ICD-10-CM | POA: Diagnosis not present

## 2023-03-25 DIAGNOSIS — A419 Sepsis, unspecified organism: Secondary | ICD-10-CM | POA: Diagnosis not present

## 2023-03-25 LAB — COMPREHENSIVE METABOLIC PANEL
ALT: 37 U/L (ref 0–44)
AST: 40 U/L (ref 15–41)
Albumin: 3.1 g/dL — ABNORMAL LOW (ref 3.5–5.0)
Alkaline Phosphatase: 84 U/L (ref 38–126)
Anion gap: 9 (ref 5–15)
BUN: 26 mg/dL — ABNORMAL HIGH (ref 8–23)
CO2: 31 mmol/L (ref 22–32)
Calcium: 8.7 mg/dL — ABNORMAL LOW (ref 8.9–10.3)
Chloride: 98 mmol/L (ref 98–111)
Creatinine, Ser: 0.79 mg/dL (ref 0.44–1.00)
GFR, Estimated: >60 mL/min (ref >60)
Glucose, Bld: 92 mg/dL (ref 70–99)
Potassium: 3.2 mmol/L — ABNORMAL LOW (ref 3.5–5.1)
Sodium: 138 mmol/L (ref 135–145)
Total Bilirubin: 0.5 mg/dL (ref 0.3–1.2)
Total Protein: 6.8 g/dL (ref 6.5–8.1)

## 2023-03-25 LAB — CULTURE, BLOOD (ROUTINE X 2): Special Requests: ADEQUATE

## 2023-03-25 MED ORDER — DOXYCYCLINE HYCLATE 100 MG PO TABS
100.0000 mg | ORAL_TABLET | Freq: Two times a day (BID) | ORAL | Status: DC
  Administered 2023-03-25 – 2023-03-26 (×2): 100 mg via ORAL
  Filled 2023-03-25 (×2): qty 1

## 2023-03-25 MED ORDER — ALBUTEROL SULFATE HFA 108 (90 BASE) MCG/ACT IN AERS
2.0000 | INHALATION_SPRAY | RESPIRATORY_TRACT | Status: DC | PRN
  Filled 2023-03-25: qty 6.7

## 2023-03-25 MED ORDER — POTASSIUM CHLORIDE CRYS ER 20 MEQ PO TBCR
40.0000 meq | EXTENDED_RELEASE_TABLET | Freq: Once | ORAL | Status: AC
  Administered 2023-03-25: 40 meq via ORAL

## 2023-03-25 NOTE — Evaluation (Signed)
Physical Therapy Evaluation Patient Details Name: Leslie Dorsey MRN: [id number] DOB: Nov 27, 1950 Today's Date: 03/25/2023  History of Present Illness  Pt is 72 yo female admitted on 03/23/23 with sepsis secondary to CAP.  Pt also reports tick bite 10 days ago.  Pt with hx including asthma, smoker, CAD, HTN, STEMI, and pt reports chronic back pain  Clinical Impression  Pt admitted with above diagnosis. She lives at home alone and is independent baseline.  Pt was on RA with VSS during session.  She ambulated 600' with supervision and tending to touch wall or rail (reports this is baseline).  She has been ambulating in room independently.  Pt is at baseline and does not require further skilled PT services.  On RA with sats 94% rest and 93% ambulation.         Assistance Recommended at Discharge None  If plan is discharge home, recommend the following:  Can travel by private vehicle           Equipment Recommendations None recommended by PT  Recommendations for Other Services       Functional Status Assessment Patient has not had a recent decline in their functional status     Precautions / Restrictions Precautions Precautions: Fall (per chart)      Mobility  Bed Mobility Overal bed mobility: Independent                  Transfers Overall transfer level: Independent                 General transfer comment: Has been mobilizing in her room on her own; demonstrated safely    Ambulation/Gait Ambulation/Gait assistance: Modified independent (Device/Increase time) Gait Distance (Feet): 600 Feet Assistive device: None Gait Pattern/deviations: WFL(Within Functional Limits) Gait velocity: normal     General Gait Details: Pt does tend to reach for rail or wall 50% of the time but reports that is her baseline.  Does drift when not touching wall but no LOB. Educated on use of cane if needed for community/outdoor but pt declines  Comptroller Bed    Modified Rankin (Stroke Patients Only)       Balance Overall balance assessment: Needs assistance Sitting-balance support: No upper extremity supported Sitting balance-Leahy Scale: Normal     Standing balance support: No upper extremity supported Standing balance-Leahy Scale: Good Standing balance comment: Pt is able to weight shift and take steps without UE support but does drift and prefers to touch wall or rail when walking                             Pertinent Vitals/Pain Pain Assessment Pain Assessment: 0-10 Pain Score: 4  Pain Location: chronic low back Pain Descriptors / Indicators: Aching Pain Intervention(s): Limited activity within patient's tolerance, Monitored during session    Home Living Family/patient expects to be discharged to:: Private residence Living Arrangements: Alone Available Help at Discharge: Family;Available PRN/intermittently Type of Home: Mobile home Home Access: Stairs to enter Entrance Stairs-Rails: Right;Left Entrance Stairs-Number of Steps: 4   Home Layout: One level Home Equipment: None      Prior Function Prior Level of Function : Independent/Modified Independent;Driving             Mobility Comments: Can ambulate in community.  Reports she always hold onto furniture, wall, rails since 2021  when spouse passed. Denies falls ADLs Comments: Pt does all adls and iadls     Hand Dominance        Extremity/Trunk Assessment   Upper Extremity Assessment Upper Extremity Assessment: Overall WFL for tasks assessed    Lower Extremity Assessment Lower Extremity Assessment: Overall WFL for tasks assessed    Cervical / Trunk Assessment Cervical / Trunk Assessment: Normal  Communication   Communication: No difficulties  Cognition Arousal/Alertness: Awake/alert Behavior During Therapy: WFL for tasks assessed/performed Overall Cognitive Status: Within Functional Limits for  tasks assessed                                 General Comments: Pt reports still some agitation and frustration from argument with her son and daughter in law earlier. She followed commands, able to wayfind back to room after 3 turns, alert and oriented x 4        General Comments General comments (skin integrity, edema, etc.): Pt on RA with sats 94% rest and 93% walk    Exercises     Assessment/Plan    PT Assessment Patient does not need any further PT services  PT Problem List         PT Treatment Interventions      PT Goals (Current goals can be found in the Care Plan section)  Acute Rehab PT Goals Patient Stated Goal: return home PT Goal Formulation: All assessment and education complete, DC therapy    Frequency       Co-evaluation               AM-PAC PT "6 Clicks" Mobility  Outcome Measure Help needed turning from your back to your side while in a flat bed without using bedrails?: None Help needed moving from lying on your back to sitting on the side of a flat bed without using bedrails?: None Help needed moving to and from a bed to a chair (including a wheelchair)?: None Help needed standing up from a chair using your arms (e.g., wheelchair or bedside chair)?: None Help needed to walk in hospital room?: None Help needed climbing 3-5 steps with a railing? : A Little 6 Click Score: 23    End of Session   Activity Tolerance: Patient tolerated treatment well Patient left: in bed;with call bell/phone within reach Nurse Communication: Mobility status;Other (comment) (pt request inhaler) PT Visit Diagnosis: Other abnormalities of gait and mobility (R26.89)    Time: 1610-9604 PT Time Calculation (min) (ACUTE ONLY): 20 min   Charges:   PT Evaluation $PT Eval Low Complexity: 1 Low   PT General Charges $$ ACUTE PT VISIT: 1 Visit         Anise Salvo, PT Acute Rehab [facility] [phone]   Rayetta Humphrey 03/25/2023, 5:33  PM

## 2023-03-25 NOTE — Progress Notes (Signed)
  Progress Note   Patient: Leslie Dorsey:[id number] DOB: 26-Nov-1950 DOA: 03/23/2023     2 DOS: the patient was seen and examined on 03/25/2023   Brief hospital course: 73 year old woman PMH including asthma, smoker, presented with cough and shortness of breath.  Found to have acute hypoxic respiratory failure.  Also reported tick bite approximately 10 days ago.  Consultants  Procedures   Assessment and Plan: Sepsis secondary to CAP (community acquired pneumonia) Acute hypoxic respiratory failure Elevated WBC and RR on admission. 85% on RA per ED triage w/ SOB. CXR with new reticulonodular opacities throughout bilateral lungs with lower lobe predominance. DDx includes interstitial lung disease, edema. BNP only modestly elevated, echo frmo 2018 w/ normal LVEF, grade 1 diastolic dysfunction. Covid negative, RVP negative. BC pending. Treat with doxycycline and ceftriaxone.   Hypoxic with ambulation, will need home oxygen Improving, home in AM   Acute delirium Acute metabolic encephalopathy UDS negative Family denies cognitive impairment. ADR to steroid -- stopped. Overall a little better   Asthma exacerbation Smoker 1/2 ppd Patient she will quit smoking.    Exacerbation resolved, continue bronchodilators as needed. Stop prednisone   Tick bite Tick was removed by patient on March 11, 2023.  No rash.  LFTs normal. Monitor clinically.   Essential hypertension Blood pressure stable.  Holding antihypertensives.  Overall better, needs home oxygen, likely home tommorow    Subjective:  Feels better Still confused at times  Physical Exam: Vitals:   03/24/23 1606 03/24/23 1926 03/25/23 0541 03/25/23 0555  BP:  (!) 122/58 135/76 135/76  Pulse:  86 76 76  Resp:  16 15 18   Temp:  (!) 97.5 F (36.4 C) (!) 97.5 F (36.4 C) 97.7 F (36.5 C)  TempSrc:  Oral Oral Oral  SpO2: 96% 96% 92%   Weight:      Height:       Physical Exam Vitals reviewed.  Constitutional:       General: She is not in acute distress.    Appearance: She is not ill-appearing or toxic-appearing.  Cardiovascular:     Rate and Rhythm: Normal rate and regular rhythm.     Heart sounds: No murmur heard. Pulmonary:     Effort: Pulmonary effort is normal. No respiratory distress.     Breath sounds: No wheezing, rhonchi or rales.  Neurological:     Mental Status: She is alert.     Comments: A bit confused, but oriented to hospital, month, year     Data Reviewed: K+ 3.2  CMP noted  Family Communication: son Arlys John by telephone  Disposition: Status is: Inpatient Remains inpatient appropriate because: pneumonia     Time spent: 35 minutes  Author: Brendia Sacks, MD 03/25/2023 8:19 AM  For on call review [email or web address].

## 2023-03-25 NOTE — Discharge Summary (Signed)
Physician Discharge Summary   Patient: Leslie Dorsey MRN: [id number] DOB: March 06, 1951  Admit date:     03/23/2023  Discharge date: 03/26/23  Discharge Physician: Brendia Sacks   PCP: Farris Has, MD   Recommendations at discharge:   See below  Discharge Diagnoses: Principal Problem:   Sepsis [facility]) Active Problems:   HTN (hypertension)   CAP (community acquired pneumonia)   Tick bite   Asthma exacerbation   Community acquired pneumonia   Acute respiratory failure with hypoxia (HCC)  Resolved Problems:   * No resolved hospital problems. *  Hospital Course: 72 year old woman PMH including asthma, smoker, presented with cough and shortness of breath.  Found to have acute hypoxic respiratory failure, pneumonia.  Also reported tick bite approximately 10 days ago.  Treated with antibiotic therapy with gradual clinical improvement.  Hypoxia resolved.  Discharged home in good condition.  Updated son by telephone  Consultants None  Procedures None  Sepsis secondary to CAP (community acquired pneumonia) Acute hypoxic respiratory failure Elevated WBC and RR on admission. 85% on RA per ED triage w/ SOB. CXR with new reticulonodular opacities throughout bilateral lungs with lower lobe predominance. BNP only modestly elevated, echo frmo 2018 w/ normal LVEF, grade 1 diastolic dysfunction. Covid negative, RVP negative. BC no growth thus far Treat with doxycycline and ceftriaxone.   Hypoxia resolved.  Complete antibiotics as an outpatient.   Acute delirium Acute metabolic encephalopathy UDS negative Family denies cognitive impairment. ADR to steroid -- stopped. Resolved.   Asthma exacerbation Smoker 1/2 ppd Patient reported she will quit smoking.    Exacerbation resolved, continue bronchodilators as needed.  Stopped prednisone   Tick bite Tick was removed by patient on March 11, 2023.  No rash.  LFTs normal. Monitor clinically.   Essential hypertension Blood  pressure stable.  Holding antihypertensives.     Consultants: None Procedures performed: None  Disposition: Home Diet recommendation:  Regular diet DISCHARGE MEDICATION: Allergies as of 03/26/2023       Reactions   Brilinta [ticagrelor] Shortness Of Breath, Other (See Comments)   Difficulty breathing    Levofloxacin Anaphylaxis, Hives, Rash   Morphine And Codeine Anaphylaxis   Shellfish-derived Products Shortness Of Breath, Other (See Comments)   Crabmeat- "Made me feel like I was dying"   Gabapentin Other (See Comments)   Does not care to take this   Lisinopril Hives   Lipitor [atorvastatin] Other (See Comments)   Myalgia        Medication List     STOP taking these medications    losartan 100 MG tablet Commonly known as: COZAAR   potassium chloride SA 20 MEQ tablet Commonly known as: KLOR-CON M       TAKE these medications    albuterol 108 (90 Base) MCG/ACT inhaler Commonly known as: VENTOLIN HFA Inhale 2 puffs into the lungs every 3 (three) hours as needed for wheezing or shortness of breath.   amLODipine 2.5 MG tablet Commonly known as: NORVASC Take 1 tablet (2.5 mg total) by mouth daily.   aspirin EC 81 MG tablet Take 1 tablet (81 mg total) by mouth daily.   cefUROXime 500 MG tablet Commonly known as: CEFTIN Take 1 tablet (500 mg total) by mouth 2 (two) times daily for 3 days.   clonazePAM 1 MG tablet Commonly known as: KLONOPIN Take 0.5-1 mg by mouth 2 (two) times daily as needed for anxiety.   clopidogrel 75 MG tablet Commonly known as: PLAVIX TAKE 1 TABLET BY MOUTH EVERY DAY  What changed: when to take this   doxycycline 100 MG tablet Commonly known as: VIBRA-TABS Take 1 tablet (100 mg total) by mouth every 12 (twelve) hours for 5 days.   ezetimibe 10 MG tablet Commonly known as: ZETIA TAKE 1 TABLET BY MOUTH EVERY DAY   hydrochlorothiazide 25 MG tablet Commonly known as: HYDRODIURIL Take 25 mg by mouth every morning.   ibuprofen 800  MG tablet Commonly known as: ADVIL Take 1 tablet (800 mg total) by mouth 3 (three) times daily. What changed:  when to take this reasons to take this   nicotine 21 mg/24hr patch Commonly known as: NICODERM CQ - dosed in mg/24 hours PLEASE SEE ATTACHED FOR DETAILED DIRECTIONS   nitroGLYCERIN 0.4 MG SL tablet Commonly known as: NITROSTAT PLACE 1 TABLET UNDER TONGUE EVERY 5 MINUTES FOR 3 DOSES AS NEEDED FOR CHEST PAIN What changed: See the new instructions.   OMEGA-3 FATTY ACIDS PO Take 800 mg by mouth daily.   oxyCODONE-acetaminophen 10-325 MG tablet Commonly known as: PERCOCET Take 0.25-1 tablets by mouth every 6 (six) hours as needed for pain.   rosuvastatin 20 MG tablet Commonly known as: CRESTOR Take 0.5 tablets (10 mg total) by mouth daily. What changed: how much to take               Durable Medical Equipment  (From admission, onward)           Start     Ordered   03/25/23 1540  For home use only DME oxygen  Once       Question Answer Comment  Length of Need 6 Months   Mode or (Route) Nasal cannula   Liters per Minute 2   Frequency Continuous (stationary and portable oxygen unit needed)   Oxygen delivery system Gas      03/25/23 1539            Follow-up Information     Farris Has, MD. Schedule an appointment as soon as possible for a visit in 1 week(s).   Specialty: Family Medicine Contact information: [address]  [address] [place] [address] [phone]               Feels better  Discharge Exam: Ceasar Mons Weights   03/23/23 1202  Weight: 77.6 kg   Physical Exam Vitals reviewed.  Constitutional:      General: She is not in acute distress.    Appearance: She is not ill-appearing or toxic-appearing.  Cardiovascular:     Rate and Rhythm: Normal rate and regular rhythm.     Heart sounds: No murmur heard. Pulmonary:     Effort: Pulmonary effort is normal. No respiratory distress.     Breath sounds: No  wheezing, rhonchi or rales.  Neurological:     Mental Status: She is alert and oriented to person, place, and time.  Psychiatric:        Mood and Affect: Mood normal.        Behavior: Behavior normal.       Condition at discharge: good  The results of significant diagnostics from this hospitalization (including imaging, microbiology, ancillary and laboratory) are listed below for reference.   Imaging Studies: DG Chest Port 1 View  Result Date: 03/23/2023 CLINICAL DATA:  sob EXAM: PORTABLE CHEST 1 VIEW COMPARISON:  07/17/2021. FINDINGS: There are new reticulonodular opacities throughout bilateral lungs with lower lobe predominance, which may represent diffuse process such as interstitial lung disease, pulmonary edema, etc. Correlate clinically. No dense  consolidation, major atelectasis or pneumothorax. Bilateral lateral costophrenic angles are clear. Normal cardio-mediastinal silhouette. No acute osseous abnormalities. The soft tissues are within normal limits. IMPRESSION: *New reticulonodular opacities throughout bilateral lungs with lower lobe predominance, which may represent diffuse process such as interstitial lung disease, pulmonary edema, etc. Electronically Signed   By: Jules Schick M.D.   On: 03/23/2023 13:41    Microbiology: Results for orders placed or performed during the hospital encounter of 03/23/23  Blood culture (routine x 2)     Status: None (Preliminary result)   Collection Time: 03/23/23  1:46 PM   Specimen: Right Antecubital; Blood  Result Value Ref Range Status   Specimen Description   Final    RIGHT ANTECUBITAL Performed at [facility], [address], [place], [place] [address]    Special Requests   Final    BOTTLES DRAWN AEROBIC AND ANAEROBIC Blood Culture adequate volume Performed at [facility], [address] [place], [place] [address]    Culture   Final    NO GROWTH 3 DAYS Performed at [facility], [address], [place], [place] [address]    Report Status PENDING  Incomplete  SARS Coronavirus 2 by RT PCR (hospital order, performed in [facility] hospital lab) *cepheid single result test* Anterior Nasal Swab     Status: None   Collection Time: 03/23/23  2:27 PM   Specimen: Anterior Nasal Swab  Result Value Ref Range Status   SARS Coronavirus 2 by RT PCR NEGATIVE NEGATIVE Final    Comment: (NOTE) SARS-CoV-2 target nucleic acids are NOT DETECTED.  The SARS-CoV-2 RNA is generally detectable in upper and lower respiratory specimens during the acute phase of infection. The lowest concentration of SARS-CoV-2 viral copies this assay can detect is 250 copies / mL. A negative result does not preclude SARS-CoV-2 infection and should not be used as the sole basis for treatment or other patient management decisions.  A negative result may occur with improper specimen collection / handling, submission of specimen other than nasopharyngeal swab, presence of viral mutation(s) within the areas targeted by this assay, and inadequate number of viral copies (<250 copies / mL). A negative result must be combined with clinical observations, patient history, and epidemiological information.  Fact Sheet for Patients:   RoadLapTop.co.za  Fact Sheet for Healthcare Providers: http://kim-miller.com/  This test is not yet approved or  cleared by the [place] FDA and has been authorized for detection and/or diagnosis of SARS-CoV-2 by FDA under an Emergency Use Authorization (EUA).  This EUA will remain in effect (meaning this test can be used) for the duration of the COVID-19 declaration under Section 564(b)(1) of the Act, 21 U.S.C. section 360bbb-3(b)(1), unless the authorization is terminated or revoked sooner.  Performed at [facility], [address], [place], [place] [address]   Blood culture (routine x 2)     Status: None  (Preliminary result)   Collection Time: 03/23/23  3:55 PM   Specimen: Site Not Specified; Blood  Result Value Ref Range Status   Specimen Description   Final    SITE NOT SPECIFIED Performed at [facility], [address] [place], [place] [address]    Special Requests   Final    BOTTLES DRAWN AEROBIC ONLY Blood Culture adequate volume Performed at [facility] [address], [place], [place] [address]    Culture   Final    NO GROWTH 3 DAYS Performed at [facility], [address], [place], [place] [address]    Report Status PENDING  Incomplete  Respiratory (~20 pathogens) panel by PCR     Status: None   Collection Time: 03/23/23  6:32 PM   Specimen: Nasopharyngeal Swab; Respiratory  Result Value Ref Range Status   Adenovirus NOT DETECTED NOT DETECTED Final   Coronavirus 229E NOT DETECTED NOT DETECTED Final    Comment: (NOTE) The Coronavirus on the Respiratory Panel, DOES NOT test for the novel  Coronavirus (2019 nCoV)    Coronavirus HKU1 NOT DETECTED NOT DETECTED Final   Coronavirus NL63 NOT DETECTED NOT DETECTED Final   Coronavirus OC43 NOT DETECTED NOT DETECTED Final   Metapneumovirus NOT DETECTED NOT DETECTED Final   Rhinovirus / Enterovirus NOT DETECTED NOT DETECTED Final   Influenza A NOT DETECTED NOT DETECTED Final   Influenza B NOT DETECTED NOT DETECTED Final   Parainfluenza Virus 1 NOT DETECTED NOT DETECTED Final   Parainfluenza Virus 2 NOT DETECTED NOT DETECTED Final   Parainfluenza Virus 3 NOT DETECTED NOT DETECTED Final   Parainfluenza Virus 4 NOT DETECTED NOT DETECTED Final   Respiratory Syncytial Virus NOT DETECTED NOT DETECTED Final   Bordetella pertussis NOT DETECTED NOT DETECTED Final   Bordetella Parapertussis NOT DETECTED NOT DETECTED Final   Chlamydophila pneumoniae NOT DETECTED NOT DETECTED Final   Mycoplasma pneumoniae NOT DETECTED NOT DETECTED Final    Comment: Performed at [facility], [address] [place], [place] [address]    Labs: CBC: Recent Labs  Lab 03/23/23 1241 03/23/23 1555 03/23/23 2346  WBC 11.8* 12.5* 11.5*  NEUTROABS 9.5*  --   --   HGB 13.7 12.2 12.4  HCT 40.2 37.5 38.3  MCV 88.2 92.1 91.0  PLT 295 273 274   Basic Metabolic Panel: Recent Labs  Lab 03/23/23 1241 03/23/23 1555 03/23/23 2346 03/25/23 0847  NA 139  --  138 138  K 3.0*  --  3.4* 3.2*  CL 94*  --  98 98  CO2 33*  --  29 31  GLUCOSE 104*  --  178* 92  BUN 22  --  21 26*  CREATININE 0.84 0.80 0.93 0.79  CALCIUM 8.8*  --  8.0* 8.7*   Liver Function Tests: Recent Labs  Lab 03/25/23 0847  AST 40  ALT 37  ALKPHOS 84  BILITOT 0.5  PROT 6.8  ALBUMIN 3.1*   CBG: No results for input(s): "GLUCAP" in the last 168 hours.  Discharge time spent: greater than 30 minutes.  Signed: Brendia Sacks, MD [facility] 03/26/2023

## 2023-03-25 NOTE — Plan of Care (Signed)

## 2023-03-26 LAB — CULTURE, BLOOD (ROUTINE X 2)

## 2023-03-26 MED ORDER — IPRATROPIUM-ALBUTEROL 0.5-2.5 (3) MG/3ML IN SOLN: 3.0000 mL | Freq: Two times a day (BID) | RESPIRATORY_TRACT | Status: DC

## 2023-03-26 MED ORDER — CEFUROXIME AXETIL 500 MG PO TABS: 500.0000 mg | ORAL_TABLET | Freq: Two times a day (BID) | ORAL | 0 refills | Status: AC

## 2023-03-26 MED ORDER — DOXYCYCLINE HYCLATE 100 MG PO TABS: 100.0000 mg | ORAL_TABLET | Freq: Two times a day (BID) | ORAL | 0 refills | Status: AC

## 2023-03-26 NOTE — Progress Notes (Signed)
  TOC consulted for HH/DME needs. Pt was able to ambulate 616ft w/ supervision during PT eval. Pt's O2 saturation remained above 88 on RA while ambulating/resting. Currently no HH/DME needs identified. TOC signing off at this time.    03/26/23 0930  TOC Brief Assessment  Insurance and Status Reviewed  Patient has primary care physician Yes  Home environment has been reviewed Home alone  Prior level of function: Independent  Prior/Current Home Services No current home services  Social Determinants of Health Reivew SDOH reviewed no interventions necessary  Readmission risk has been reviewed Yes  Transition of care needs no transition of care needs at this time

## 2023-03-28 LAB — CULTURE, BLOOD (ROUTINE X 2)
Culture: NO GROWTH
Culture: NO GROWTH
Special Requests: ADEQUATE

## 2023-03-31 DIAGNOSIS — J45909 Unspecified asthma, uncomplicated: Secondary | ICD-10-CM | POA: Diagnosis not present

## 2023-03-31 DIAGNOSIS — Z23 Encounter for immunization: Secondary | ICD-10-CM | POA: Diagnosis not present

## 2023-03-31 DIAGNOSIS — Z09 Encounter for follow-up examination after completed treatment for conditions other than malignant neoplasm: Secondary | ICD-10-CM | POA: Diagnosis not present

## 2023-03-31 DIAGNOSIS — Z720 Tobacco use: Secondary | ICD-10-CM | POA: Diagnosis not present

## 2023-03-31 DIAGNOSIS — I10 Essential (primary) hypertension: Secondary | ICD-10-CM | POA: Diagnosis not present

## 2023-04-19 DIAGNOSIS — M48061 Spinal stenosis, lumbar region without neurogenic claudication: Secondary | ICD-10-CM | POA: Diagnosis not present

## 2023-04-19 DIAGNOSIS — M47816 Spondylosis without myelopathy or radiculopathy, lumbar region: Secondary | ICD-10-CM | POA: Diagnosis not present

## 2023-04-19 DIAGNOSIS — G894 Chronic pain syndrome: Secondary | ICD-10-CM | POA: Diagnosis not present

## 2023-04-19 DIAGNOSIS — M5416 Radiculopathy, lumbar region: Secondary | ICD-10-CM | POA: Diagnosis not present

## 2023-04-22 DIAGNOSIS — J988 Other specified respiratory disorders: Secondary | ICD-10-CM | POA: Diagnosis not present

## 2023-04-22 DIAGNOSIS — Z720 Tobacco use: Secondary | ICD-10-CM | POA: Diagnosis not present

## 2023-04-22 DIAGNOSIS — J45909 Unspecified asthma, uncomplicated: Secondary | ICD-10-CM | POA: Diagnosis not present

## 2023-05-11 ENCOUNTER — Other Ambulatory Visit: Payer: Self-pay | Admitting: Cardiovascular Disease

## 2023-05-20 DIAGNOSIS — M47816 Spondylosis without myelopathy or radiculopathy, lumbar region: Secondary | ICD-10-CM | POA: Diagnosis not present

## 2023-05-20 DIAGNOSIS — M48061 Spinal stenosis, lumbar region without neurogenic claudication: Secondary | ICD-10-CM | POA: Diagnosis not present

## 2023-05-20 DIAGNOSIS — M5416 Radiculopathy, lumbar region: Secondary | ICD-10-CM | POA: Diagnosis not present

## 2023-05-20 DIAGNOSIS — G894 Chronic pain syndrome: Secondary | ICD-10-CM | POA: Diagnosis not present

## 2023-05-27 ENCOUNTER — Other Ambulatory Visit: Payer: Self-pay | Admitting: Cardiovascular Disease

## 2023-06-05 ENCOUNTER — Other Ambulatory Visit: Payer: Self-pay | Admitting: Cardiovascular Disease

## 2023-06-07 NOTE — Telephone Encounter (Signed)
Mrs Placke is overdue for an appointment. She is requesting ezetimibe and is past her 3rd notice. Is this something that Dr. Flora Lipps wants to refill?

## 2023-06-08 NOTE — Telephone Encounter (Signed)
RN called and spoke to patient. Patient states she has not seen anyone because  she called in the past for an  issue and was informed there was not an appointment for 3 months. She states she did not try again. Patient also staes she is a patient of Dr Tresa Endo not Dr Val EagleJennette Kettle. RN informed patient last visit were in 2010for the office to continue refilling medicaitons she would need to be seen by a provider.   RN informed  patient she may have to  schedule an appointment with with PA or NP if Dr Tresa Endo schedule appointment is full.  Patient very adamant she will not see anyone other than a physician  " if I wanted to see a nurse I will see one in  a hospital " She mention other things why she wanted to see a doctor only.   Appointment was available for Dr Tresa Endo  on 06/29/23 at 2:30 pm.patient verbalized and agree to appointment time.  Aware medication refilled _ Generic  --Zetia-- for 30 days only and to continue medication refill she will need to keep appointment  Patient voiced understanding.

## 2023-06-17 DIAGNOSIS — M47816 Spondylosis without myelopathy or radiculopathy, lumbar region: Secondary | ICD-10-CM | POA: Diagnosis not present

## 2023-06-17 DIAGNOSIS — G894 Chronic pain syndrome: Secondary | ICD-10-CM | POA: Diagnosis not present

## 2023-06-17 DIAGNOSIS — M48061 Spinal stenosis, lumbar region without neurogenic claudication: Secondary | ICD-10-CM | POA: Diagnosis not present

## 2023-06-17 DIAGNOSIS — M5416 Radiculopathy, lumbar region: Secondary | ICD-10-CM | POA: Diagnosis not present

## 2023-06-29 ENCOUNTER — Ambulatory Visit: Payer: Medicare Other | Admitting: Cardiovascular Disease

## 2023-07-04 ENCOUNTER — Other Ambulatory Visit: Payer: Self-pay | Admitting: Cardiovascular Disease

## 2023-07-15 DIAGNOSIS — M5416 Radiculopathy, lumbar region: Secondary | ICD-10-CM | POA: Diagnosis not present

## 2023-07-15 DIAGNOSIS — G894 Chronic pain syndrome: Secondary | ICD-10-CM | POA: Diagnosis not present

## 2023-07-15 DIAGNOSIS — M48061 Spinal stenosis, lumbar region without neurogenic claudication: Secondary | ICD-10-CM | POA: Diagnosis not present

## 2023-07-15 DIAGNOSIS — M47816 Spondylosis without myelopathy or radiculopathy, lumbar region: Secondary | ICD-10-CM | POA: Diagnosis not present

## 2023-07-28 DIAGNOSIS — Z720 Tobacco use: Secondary | ICD-10-CM | POA: Diagnosis not present

## 2023-07-28 DIAGNOSIS — R7309 Other abnormal glucose: Secondary | ICD-10-CM | POA: Diagnosis not present

## 2023-07-28 DIAGNOSIS — E785 Hyperlipidemia, unspecified: Secondary | ICD-10-CM | POA: Diagnosis not present

## 2023-07-28 DIAGNOSIS — I10 Essential (primary) hypertension: Secondary | ICD-10-CM | POA: Diagnosis not present

## 2023-07-28 DIAGNOSIS — I2510 Atherosclerotic heart disease of native coronary artery without angina pectoris: Secondary | ICD-10-CM | POA: Diagnosis not present

## 2023-07-28 DIAGNOSIS — Z0000 Encounter for general adult medical examination without abnormal findings: Secondary | ICD-10-CM | POA: Diagnosis not present

## 2023-08-15 DIAGNOSIS — M5416 Radiculopathy, lumbar region: Secondary | ICD-10-CM | POA: Diagnosis not present

## 2023-08-15 DIAGNOSIS — G894 Chronic pain syndrome: Secondary | ICD-10-CM | POA: Diagnosis not present

## 2023-08-15 DIAGNOSIS — M48061 Spinal stenosis, lumbar region without neurogenic claudication: Secondary | ICD-10-CM | POA: Diagnosis not present

## 2023-08-15 DIAGNOSIS — M47816 Spondylosis without myelopathy or radiculopathy, lumbar region: Secondary | ICD-10-CM | POA: Diagnosis not present

## 2023-08-30 ENCOUNTER — Encounter: Payer: Self-pay | Admitting: Cardiovascular Disease

## 2023-09-13 DIAGNOSIS — G894 Chronic pain syndrome: Secondary | ICD-10-CM | POA: Diagnosis not present

## 2023-09-13 DIAGNOSIS — M48061 Spinal stenosis, lumbar region without neurogenic claudication: Secondary | ICD-10-CM | POA: Diagnosis not present

## 2023-09-13 DIAGNOSIS — M5416 Radiculopathy, lumbar region: Secondary | ICD-10-CM | POA: Diagnosis not present

## 2023-09-13 DIAGNOSIS — M47816 Spondylosis without myelopathy or radiculopathy, lumbar region: Secondary | ICD-10-CM | POA: Diagnosis not present

## 2023-09-16 ENCOUNTER — Ambulatory Visit: Payer: Medicare Other | Admitting: Cardiovascular Disease

## 2023-10-11 DIAGNOSIS — M48061 Spinal stenosis, lumbar region without neurogenic claudication: Secondary | ICD-10-CM | POA: Diagnosis not present

## 2023-10-11 DIAGNOSIS — M47816 Spondylosis without myelopathy or radiculopathy, lumbar region: Secondary | ICD-10-CM | POA: Diagnosis not present

## 2023-10-11 DIAGNOSIS — G894 Chronic pain syndrome: Secondary | ICD-10-CM | POA: Diagnosis not present

## 2023-10-11 DIAGNOSIS — M5416 Radiculopathy, lumbar region: Secondary | ICD-10-CM | POA: Diagnosis not present

## 2023-11-08 ENCOUNTER — Other Ambulatory Visit (HOSPITAL_COMMUNITY): Payer: Self-pay

## 2023-11-08 DIAGNOSIS — G894 Chronic pain syndrome: Secondary | ICD-10-CM | POA: Diagnosis not present

## 2023-11-08 DIAGNOSIS — M47816 Spondylosis without myelopathy or radiculopathy, lumbar region: Secondary | ICD-10-CM | POA: Diagnosis not present

## 2023-11-08 DIAGNOSIS — M5416 Radiculopathy, lumbar region: Secondary | ICD-10-CM | POA: Diagnosis not present

## 2023-11-08 DIAGNOSIS — M48061 Spinal stenosis, lumbar region without neurogenic claudication: Secondary | ICD-10-CM | POA: Diagnosis not present

## 2023-12-06 DIAGNOSIS — G894 Chronic pain syndrome: Secondary | ICD-10-CM | POA: Diagnosis not present

## 2023-12-06 DIAGNOSIS — M47816 Spondylosis without myelopathy or radiculopathy, lumbar region: Secondary | ICD-10-CM | POA: Diagnosis not present

## 2023-12-06 DIAGNOSIS — M5416 Radiculopathy, lumbar region: Secondary | ICD-10-CM | POA: Diagnosis not present

## 2023-12-06 DIAGNOSIS — M48061 Spinal stenosis, lumbar region without neurogenic claudication: Secondary | ICD-10-CM | POA: Diagnosis not present

## 2023-12-07 ENCOUNTER — Ambulatory Visit: Payer: Medicare Other | Admitting: Cardiovascular Disease

## 2024-01-05 DIAGNOSIS — M47816 Spondylosis without myelopathy or radiculopathy, lumbar region: Secondary | ICD-10-CM | POA: Diagnosis not present

## 2024-01-05 DIAGNOSIS — M48061 Spinal stenosis, lumbar region without neurogenic claudication: Secondary | ICD-10-CM | POA: Diagnosis not present

## 2024-01-05 DIAGNOSIS — G894 Chronic pain syndrome: Secondary | ICD-10-CM | POA: Diagnosis not present

## 2024-01-05 DIAGNOSIS — M5416 Radiculopathy, lumbar region: Secondary | ICD-10-CM | POA: Diagnosis not present

## 2024-01-27 DIAGNOSIS — I10 Essential (primary) hypertension: Secondary | ICD-10-CM | POA: Diagnosis not present

## 2024-01-27 DIAGNOSIS — I2510 Atherosclerotic heart disease of native coronary artery without angina pectoris: Secondary | ICD-10-CM | POA: Diagnosis not present

## 2024-01-27 DIAGNOSIS — R7309 Other abnormal glucose: Secondary | ICD-10-CM | POA: Diagnosis not present

## 2024-01-27 DIAGNOSIS — I25119 Atherosclerotic heart disease of native coronary artery with unspecified angina pectoris: Secondary | ICD-10-CM | POA: Diagnosis not present

## 2024-01-27 DIAGNOSIS — E785 Hyperlipidemia, unspecified: Secondary | ICD-10-CM | POA: Diagnosis not present

## 2024-02-02 DIAGNOSIS — M5416 Radiculopathy, lumbar region: Secondary | ICD-10-CM | POA: Diagnosis not present

## 2024-02-02 DIAGNOSIS — G894 Chronic pain syndrome: Secondary | ICD-10-CM | POA: Diagnosis not present

## 2024-02-02 DIAGNOSIS — M48061 Spinal stenosis, lumbar region without neurogenic claudication: Secondary | ICD-10-CM | POA: Diagnosis not present

## 2024-02-02 DIAGNOSIS — M47816 Spondylosis without myelopathy or radiculopathy, lumbar region: Secondary | ICD-10-CM | POA: Diagnosis not present

## 2024-02-13 ENCOUNTER — Ambulatory Visit: Admitting: Cardiovascular Disease

## 2024-03-05 DIAGNOSIS — M48061 Spinal stenosis, lumbar region without neurogenic claudication: Secondary | ICD-10-CM | POA: Diagnosis not present

## 2024-03-05 DIAGNOSIS — G894 Chronic pain syndrome: Secondary | ICD-10-CM | POA: Diagnosis not present

## 2024-03-05 DIAGNOSIS — M47816 Spondylosis without myelopathy or radiculopathy, lumbar region: Secondary | ICD-10-CM | POA: Diagnosis not present

## 2024-03-05 DIAGNOSIS — M5416 Radiculopathy, lumbar region: Secondary | ICD-10-CM | POA: Diagnosis not present

## 2024-03-26 NOTE — Progress Notes (Deleted)
 Cardiology Office Note:    Date:  03/26/2024   ID:  Leslie Dorsey, DOB 05-Aug-1951, MRN [id number]  PCP:  Kip Righter, MD   [facility] Providers Cardiologist:  Debby Sor, MD { Click to update primary MD,subspecialty MD or APP then REFRESH:1}    Referring MD: Kip Righter, MD   No chief complaint on file. ***  History of Present Illness:    Leslie Dorsey is a 73 y.o. female with a hx of HTN, tobacco abuse,   She was hospitalized 03/2023 with sepsis secondary to PNA.       Past Medical History:  Diagnosis Date   CAD (coronary artery disease)    a. Inf STEMI 08/2015 - inferior STEMI 08/2015 s/p DES to RCA.   Essential hypertension    Hyperglycemia    a. 08/2015: A1C 5.8.   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 (HCC)    Tobacco abuse     Past Surgical History:  Procedure Laterality Date   ABDOMINAL HYSTERECTOMY     CARDIAC CATHETERIZATION N/A 08/26/2015   Procedure: Left Heart Cath and Coronary Angiography;  Surgeon: Debby DELENA Sor, MD;  Location: [facility] INVASIVE CV LAB;  Service: Cardiovascular;  Laterality: N/A;   CARDIAC CATHETERIZATION N/A 08/26/2015   Procedure: Coronary Stent Intervention;  Surgeon: Debby DELENA Sor, MD;  Location: [facility] INVASIVE CV LAB;  Service: Cardiovascular;  Laterality: N/A;   HEMORROIDECTOMY      Current Medications: No outpatient medications have been marked as taking for the 03/28/24 encounter (Appointment) with Madie Jon Garre, PA.     Allergies:   Brilinta  [ticagrelor ], Levofloxacin, Morphine and codeine, Shellfish-derived products, Gabapentin, Lisinopril , Solu-medrol  [methylprednisolone ], and Lipitor  [atorvastatin ]   Social History   Socioeconomic History   Marital status: Married    Spouse name: Not on file   Number of children: Not on file   Years of education: Not on file   Highest education level: Not on file  Occupational History    Not on file  Tobacco Use   Smoking status: Former    Current packs/day: 0.00    Types: Cigarettes    Quit date: 08/26/2015    Years since quitting: 8.5   Smokeless tobacco: Never  Substance and Sexual Activity   Alcohol use: No   Drug use: No   Sexual activity: Not on file  Other Topics Concern   Not on file  Social History Narrative   Not on file   Social Drivers of Health   Financial Resource Strain: Not on file  Food Insecurity: No Food Insecurity (03/23/2023)   Hunger Vital Sign    Worried About Running Out of Food in the Last Year: Never true    Ran Out of Food in the Last Year: Never true  Transportation Needs: No Transportation Needs (03/23/2023)   PRAPARE - Administrator, Civil Service (Medical): No    Lack of Transportation (Non-Medical): No  Physical Activity: Not on file  Stress: Not on file  Social Connections: Not on file     Family History: The patient's ***family history includes Heart attack in her mother; Heart disease in her mother.  ROS:   Please see the history of present illness.    *** All other systems reviewed and are negative.  EKGs/Labs/Other Studies Reviewed:    The following studies were reviewed today: ***      Recent Labs: No results found for requested  labs within last 365 days.  Recent Lipid Panel    Component Value Date/Time   CHOL 208 (H) 12/07/2016 1133   TRIG 120 12/07/2016 1133   HDL 56 12/07/2016 1133   CHOLHDL 3.7 12/07/2016 1133   VLDL 24 12/07/2016 1133   LDLCALC 128 (H) 12/07/2016 1133     Risk Assessment/Calculations:   {Does this patient have ATRIAL FIBRILLATION?:[phone]}  No BP recorded.  {Refresh Note OR Click here to enter BP  :1}***         Physical Exam:    VS:  There were no vitals taken for this visit.    Wt Readings from Last 3 Encounters:  03/23/23 171 lb (77.6 kg)  07/22/21 174 lb (78.9 kg)  01/20/21 180 lb 12.8 oz (82 kg)     GEN: *** Well nourished, well developed in no  acute distress HEENT: Normal NECK: No JVD; No carotid bruits LYMPHATICS: No lymphadenopathy CARDIAC: ***RRR, no murmurs, rubs, gallops RESPIRATORY:  Clear to auscultation without rales, wheezing or rhonchi  ABDOMEN: Soft, non-tender, non-distended MUSCULOSKELETAL:  No edema; No deformity  SKIN: Warm and dry NEUROLOGIC:  Alert and oriented x 3 PSYCHIATRIC:  Normal affect   ASSESSMENT:    No diagnosis found. PLAN:    In order of problems listed above:  ***      {Are you ordering a CV Procedure (e.g. stress test, cath, DCCV, TEE, etc)?   Press F2        :[id number]}    Medication Adjustments/Labs and Tests Ordered: Current medicines are reviewed at length with the patient today.  Concerns regarding medicines are outlined above.  No orders of the defined types were placed in this encounter.  No orders of the defined types were placed in this encounter.   There are no Patient Instructions on file for this visit.   Signed, Jon Nat Hails, [place]  03/26/2024 10:32 AM    [facility]

## 2024-03-27 NOTE — Progress Notes (Deleted)
 Cardiology Office Note:  .   Date:  03/27/2024  ID:  Leslie Dorsey, DOB [birth year], MRN [id number] PCP: Kip Righter, MD  [facility] Providers Cardiologist:  Debby Sor, MD (Inactive) { Click to update primary MD,subspecialty MD or APP then REFRESH:1}   History of Present Illness: .   Leslie Dorsey is a 73 y.o. female  with PMHx of CAD (s/p STEMI 08/2015 with DES to pRCA), HLD, HTN, tobacco use, asthma who reports to [facility] office for follow up.   Last seen in heartcare 07/2021 with Dr. Barbaraann for evaluation of chest pain, which was felt to be noncardiac MSK pain. Offered stress test but patient declined. Treated with ibuprofen  TID  for 7 days. Discussed recent ER visit with hypokalemia. Offered to change hydrochlorothiazide  but patient preferred to take K supplement. Ordered 20 MEQ K daily.   Recent hospitalization 7/17-20/2024 for cough and SOB. She was found to have acute hypoxic respiratory and pneumonia. Treated with antibiotic therapy with gradual clinical improvement. Hypoxia resolved and discharged in good condition. Blood pressure was stable.   Today, reports ### and denies ###.  Reports compliance with medications.  Dietary habitats:  Activity level:  Social: Denies tobacco use/Binging ETOH/drug use  Denies any hospitalizations or visits to the emergency department.   CAD in native artery Hyperlipidemia, unspecified hyperlipidemia type s/p STEMI 08/2015 with DES to pRCA Denies any CP or exertional symptoms. No need for further ischemic evaluation at this time.  2018 LDL 128, 03/2023 LFL WNL. Request labs from PCP or order FLP  Continue on ASA 81 mg, Plavix  75 mg daily, Losartan  100 mg daily, Crestor  10 mg daily,  Zetia  10 mg daily, and NTG prn   Essential hypertension BP in OV today, well controlled:  Continue on Amlodipine  2.5 mg daily, hydrochlorothiazide  25 mg daily, and Losartan  as above.   Tobacco abuse Smoke # PPD  Discussed cessation but  patient is not interested at this time.   Hypokalemia 03/25/2023 K 3.2 KCL # MEQ  daily???  Order BMP.   ROS: 10 point review of system has been reviewed and considered negative except ones been listed in the HPI.   Studies Reviewed: SABRA   LHC 08/26/2015 Ost Cx lesion, 10% stenosed. Mid LAD to Dist LAD lesion, 20% stenosed. Prox RCA lesion, 99% stenosed. Post intervention, there is a 0% residual stenosis. The left ventricular systolic function is normal.  Lexiscan  12/2016 Nuclear stress EF: 72%. The left ventricular ejection fraction is hyperdynamic (>65%). There was no ST segment deviation noted during stress. The study is normal.   Normal stress nuclear study with no ischemia or infarction; EF 72 with normal wall motion. ECHO 12/2016 Study Conclusions  - Left ventricle: The cavity size was normal. There was mild focal    basal hypertrophy of the septum. Systolic function was vigorous.    The estimated ejection fraction was in the range of 65% to 70%.    Wall motion was normal; there were no regional wall motion    abnormalities. Doppler parameters are consistent with abnormal    left ventricular relaxation (grade 1 diastolic dysfunction).  Risk Assessment/Calculations:   {Does this patient have ATRIAL FIBRILLATION?:[phone]} No BP recorded.  {Refresh Note OR Click here to enter BP  :1}***       Physical Exam:   VS:  There were no vitals taken for this visit.   Wt Readings from Last 3 Encounters:  03/23/23 171 lb (77.6 kg)  07/22/21 174 lb (78.9  kg)  01/20/21 180 lb 12.8 oz (82 kg)    GEN: Well nourished, well developed in no acute distress while sitting in chair.  NECK: No JVD; No carotid bruits CARDIAC: ***RRR, no murmurs, rubs, gallops RESPIRATORY:  Clear to auscultation without rales, wheezing or rhonchi  ABDOMEN: Soft, non-tender, non-distended EXTREMITIES:  No edema; No deformity   ASSESSMENT AND PLAN: .   ***    {Are you ordering a CV Procedure (e.g. stress  test, cath, DCCV, TEE, etc)?   Press F2        :[id number]}  Dispo: ***  Signed, Lorette CINDERELLA Kapur, PA-C

## 2024-03-28 ENCOUNTER — Ambulatory Visit: Admitting: Physician Assistant

## 2024-03-28 DIAGNOSIS — Z720 Tobacco use: Secondary | ICD-10-CM

## 2024-03-28 DIAGNOSIS — E785 Hyperlipidemia, unspecified: Secondary | ICD-10-CM

## 2024-03-28 DIAGNOSIS — I10 Essential (primary) hypertension: Secondary | ICD-10-CM

## 2024-03-28 DIAGNOSIS — I2510 Atherosclerotic heart disease of native coronary artery without angina pectoris: Secondary | ICD-10-CM

## 2024-03-28 DIAGNOSIS — E876 Hypokalemia: Secondary | ICD-10-CM

## 2024-04-02 DIAGNOSIS — M47816 Spondylosis without myelopathy or radiculopathy, lumbar region: Secondary | ICD-10-CM | POA: Diagnosis not present

## 2024-04-02 DIAGNOSIS — G894 Chronic pain syndrome: Secondary | ICD-10-CM | POA: Diagnosis not present

## 2024-04-02 DIAGNOSIS — M5416 Radiculopathy, lumbar region: Secondary | ICD-10-CM | POA: Diagnosis not present

## 2024-04-02 DIAGNOSIS — M48061 Spinal stenosis, lumbar region without neurogenic claudication: Secondary | ICD-10-CM | POA: Diagnosis not present

## 2024-05-01 DIAGNOSIS — M5416 Radiculopathy, lumbar region: Secondary | ICD-10-CM | POA: Diagnosis not present

## 2024-05-01 DIAGNOSIS — M47816 Spondylosis without myelopathy or radiculopathy, lumbar region: Secondary | ICD-10-CM | POA: Diagnosis not present

## 2024-05-01 DIAGNOSIS — G894 Chronic pain syndrome: Secondary | ICD-10-CM | POA: Diagnosis not present

## 2024-05-01 DIAGNOSIS — M48061 Spinal stenosis, lumbar region without neurogenic claudication: Secondary | ICD-10-CM | POA: Diagnosis not present

## 2024-05-09 ENCOUNTER — Ambulatory Visit: Admitting: Internal Medicine

## 2024-05-29 DIAGNOSIS — M5416 Radiculopathy, lumbar region: Secondary | ICD-10-CM | POA: Diagnosis not present

## 2024-05-29 DIAGNOSIS — G894 Chronic pain syndrome: Secondary | ICD-10-CM | POA: Diagnosis not present

## 2024-05-29 DIAGNOSIS — M47816 Spondylosis without myelopathy or radiculopathy, lumbar region: Secondary | ICD-10-CM | POA: Diagnosis not present

## 2024-05-29 DIAGNOSIS — M48061 Spinal stenosis, lumbar region without neurogenic claudication: Secondary | ICD-10-CM | POA: Diagnosis not present

## 2024-06-05 ENCOUNTER — Emergency Department (HOSPITAL_BASED_OUTPATIENT_CLINIC_OR_DEPARTMENT_OTHER)

## 2024-06-05 ENCOUNTER — Other Ambulatory Visit: Payer: Self-pay

## 2024-06-05 ENCOUNTER — Emergency Department (HOSPITAL_BASED_OUTPATIENT_CLINIC_OR_DEPARTMENT_OTHER)
Admission: EM | Admit: 2024-06-05 | Discharge: 2024-06-05 | Disposition: A | Discharge status: Home / Self Care | Attending: Emergency Medicine | Admitting: Emergency Medicine

## 2024-06-05 DIAGNOSIS — Z79899 Other long term (current) drug therapy: Secondary | ICD-10-CM | POA: Insufficient documentation

## 2024-06-05 DIAGNOSIS — S92331A Displaced fracture of third metatarsal bone, right foot, initial encounter for closed fracture: Secondary | ICD-10-CM | POA: Insufficient documentation

## 2024-06-05 DIAGNOSIS — I10 Essential (primary) hypertension: Secondary | ICD-10-CM | POA: Insufficient documentation

## 2024-06-05 DIAGNOSIS — Y9301 Activity, walking, marching and hiking: Secondary | ICD-10-CM | POA: Diagnosis not present

## 2024-06-05 DIAGNOSIS — S92351A Displaced fracture of fifth metatarsal bone, right foot, initial encounter for closed fracture: Secondary | ICD-10-CM | POA: Diagnosis not present

## 2024-06-05 DIAGNOSIS — S92341A Displaced fracture of fourth metatarsal bone, right foot, initial encounter for closed fracture: Secondary | ICD-10-CM | POA: Diagnosis not present

## 2024-06-05 DIAGNOSIS — I2510 Atherosclerotic heart disease of native coronary artery without angina pectoris: Secondary | ICD-10-CM | POA: Insufficient documentation

## 2024-06-05 DIAGNOSIS — W010XXA Fall on same level from slipping, tripping and stumbling without subsequent striking against object, initial encounter: Secondary | ICD-10-CM | POA: Diagnosis not present

## 2024-06-05 DIAGNOSIS — Z87891 Personal history of nicotine dependence: Secondary | ICD-10-CM | POA: Insufficient documentation

## 2024-06-05 DIAGNOSIS — Z7902 Long term (current) use of antithrombotics/antiplatelets: Secondary | ICD-10-CM | POA: Diagnosis not present

## 2024-06-05 DIAGNOSIS — J45909 Unspecified asthma, uncomplicated: Secondary | ICD-10-CM | POA: Diagnosis not present

## 2024-06-05 DIAGNOSIS — Z7982 Long term (current) use of aspirin: Secondary | ICD-10-CM | POA: Diagnosis not present

## 2024-06-05 DIAGNOSIS — S92321A Displaced fracture of second metatarsal bone, right foot, initial encounter for closed fracture: Secondary | ICD-10-CM | POA: Diagnosis not present

## 2024-06-05 DIAGNOSIS — M79671 Pain in right foot: Secondary | ICD-10-CM | POA: Diagnosis present

## 2024-06-05 NOTE — ED Triage Notes (Signed)
 Pt POV after mechanical fall, tripped and fell on hardwood floor, reporting R foot pain, unable to bear weight.

## 2024-06-05 NOTE — Discharge Instructions (Addendum)
 Thank you for letting us  evaluate you today.  You have a fracture to your 3rd and 4th toe.  Keep cam boot on and remain nonweightbearing on right foot until you are able to follow-up with podiatry.  I provided their number.  Call them tomorrow morning and schedule an appointment

## 2024-06-05 NOTE — ED Notes (Signed)
 Reviewed AVS/discharge instructions with patient. Time allotted for and all questions answered. Patient is agreeable for d/c and escorted to ED exit by staff.

## 2024-06-05 NOTE — ED Provider Notes (Signed)
 [place] EMERGENCY DEPARTMENT AT [facility] Provider Note   CSN: [id number] Arrival date & time: 06/05/24  1655     Patient presents with: Leslie Dorsey is a 73 y.o. female with past medical history of HTN, STEMI, CAD, asthma presents to Emergency Department for evaluation of right foot pain following fall.  Reports that she was walking to a couch and turned around suddenly and rolled her ankle.  Pain with bearing weight.  Takes oxycodone  for pain.  Denies head injury, LOC, complaints prior to injury    Fall       Prior to Admission medications   Medication Sig Start Date End Date Taking? Authorizing Provider  albuterol  (PROVENTIL  HFA;VENTOLIN  HFA) 108 (90 Base) MCG/ACT inhaler Inhale 2 puffs into the lungs every 3 (three) hours as needed for wheezing or shortness of breath.    [provider]  amLODipine  (NORVASC ) 2.5 MG tablet Take 1 tablet (2.5 mg total) by mouth daily. 01/08/20   Meng, Hao, PA  aspirin  EC 81 MG EC tablet Take 1 tablet (81 mg total) by mouth daily. 08/28/15   Meng, Hao, PA  clonazePAM  (KLONOPIN ) 1 MG tablet Take 0.5-1 mg by mouth 2 (two) times daily as needed for anxiety. 08/12/15   [provider]  clopidogrel  (PLAVIX ) 75 MG tablet TAKE 1 TABLET BY MOUTH EVERY DAY Patient taking differently: Take 75 mg by mouth in the morning. 04/21/20   Burnard Debby LABOR, MD  ezetimibe  (ZETIA ) 10 MG tablet Take 1 tablet (10 mg total) by mouth daily. Must keep 09/2023 appointment 07/05/23   Burnard Debby LABOR, MD  hydrochlorothiazide  (HYDRODIURIL ) 25 MG tablet Take 25 mg by mouth every morning. 06/05/19   [provider]  ibuprofen  (ADVIL ) 800 MG tablet Take 1 tablet (800 mg total) by mouth 3 (three) times daily. Patient taking differently: Take 800 mg by mouth every 8 (eight) hours as needed for mild pain or headache. 07/22/21   O'NealDarryle Debby, MD  nicotine  (NICODERM CQ  - DOSED IN MG/24 HOURS) 21 mg/24hr patch PLEASE SEE ATTACHED  FOR DETAILED DIRECTIONS Patient not taking: Reported on 03/23/2023 04/21/20   Croitoru, Mihai, MD  nitroGLYCERIN  (NITROSTAT ) 0.4 MG SL tablet PLACE 1 TABLET UNDER TONGUE EVERY 5 MINUTES FOR 3 DOSES AS NEEDED FOR CHEST PAIN Patient taking differently: Place 0.4 mg under the tongue every 5 (five) minutes x 3 doses as needed for chest pain. 04/29/20   Burnard Debby LABOR, MD  OMEGA-3 FATTY ACIDS PO Take 800 mg by mouth daily.    [provider]  oxyCODONE -acetaminophen  (PERCOCET) 10-325 MG tablet Take 0.25-1 tablets by mouth every 6 (six) hours as needed for pain. 06/12/19   [provider]  rosuvastatin  (CRESTOR ) 20 MG tablet Take 0.5 tablets (10 mg total) by mouth daily. Patient taking differently: Take 20 mg by mouth daily. 01/08/20   Meng, Hao, PA    Allergies: Brilinta  [ticagrelor ], Levofloxacin, Morphine and codeine, Shellfish-derived products, Gabapentin, Lisinopril , Solu-medrol  [methylprednisolone ], and Lipitor  [atorvastatin ]    Review of Systems  Musculoskeletal:  Positive for arthralgias.    Updated Vital Signs BP (!) 179/83   Pulse 78   Temp 97.9 F (36.6 C)   Resp 16   Ht 5' 5 (1.651 m)   Wt 73.9 kg   SpO2 95%   BMI 27.12 kg/m   Physical Exam Vitals and nursing note reviewed.  Constitutional:      General: She is not in acute distress.    Appearance: Normal  appearance.  HENT:     Head: Normocephalic and atraumatic.  Eyes:     Conjunctiva/sclera: Conjunctivae normal.  Cardiovascular:     Rate and Rhythm: Normal rate.  Pulmonary:     Effort: Pulmonary effort is normal. No respiratory distress.  Musculoskeletal:        General: Swelling and tenderness present.     Comments: Mild swelling and tenderness to dorsal aspect of right foot.  No erythema, warmth, break in skin integrity.  Sensation 2/2 of all digits of right foot.  DP 2+.  ROM of digits limited.  No right ankle bony tenderness nor knee bony tenderness.  Skin:    Coloration: Skin is not jaundiced or  pale.  Neurological:     Mental Status: She is alert. Mental status is at baseline.     (all labs ordered are listed, but only abnormal results are displayed) Labs Reviewed - No data to display  EKG: None  Radiology: DG Foot Complete Right Result Date: 06/05/2024 CLINICAL DATA:  Trip and fall with right foot pain. EXAM: RIGHT FOOT COMPLETE - 3+ VIEW COMPARISON:  None Available. FINDINGS: Mildly displaced fracture of the third and fourth metatarsal head/necks. The fourth metatarsal fracture is minimally comminuted. No intra-articular involvement. The alignment is normal. Scattered minor degenerative spurring. Mild soft tissue edema seen at the fracture site. IMPRESSION: Mildly displaced fractures of the third and fourth metatarsal head/necks. Electronically Signed   By: Andrea Gasman M.D.   On: 06/05/2024 18:51     .Ortho Injury Treatment  Date/Time: 06/05/2024 7:03 PM  Performed by: Minnie Tinnie BRAVO, PA Authorized by: Minnie Tinnie BRAVO, PA   Consent:    Consent obtained:  Verbal   Consent given by:  Patient   Risks discussed:  FractureInjury location: foot Injury type: fracture Fracture type: third metatarsal and fourth metatarsal Pre-procedure neurovascular assessment: neurovascularly intact Immobilization: crutches and brace Splint Applied by: ED Tech Post-procedure neurovascular assessment: post-procedure neurovascularly intact      Medications Ordered in the ED - No data to display                                  Medical Decision Making Amount and/or Complexity of Data Reviewed Radiology: ordered.    Patient presents to the ED for concern of right foot pain, this involves an extensive number of treatment options, and is a complaint that carries with it a high risk of complications and morbidity.  The differential diagnosis includes rupture, dislocation, contusion, abrasion, laceration, infection, vascular injury, compartment syndrome   Co morbidities that  complicate the patient evaluation  See HPI   Additional history obtained:  Additional history obtained from Nursing   External records from outside source obtained and reviewed including triage RN note     Imaging Studies ordered:  I ordered imaging studies including right foot x-ray I independently visualized and interpreted imaging which showed mildly displaced fracture of 3rd and 4th metatarsal of right foot I agree with the radiologist interpretation     Problem List / ED Course:  Close displaced fracture of 3rd and 4th metatarsal of right foot Sensation of right foot and right toe intact.  Motor of right digits limited Well-perfused extremity that is warm.  DP 2+.  Low suspicion for vascular injury No bony tenderness to calcaneus, right ankle, right knee No signs of infection No tight compartments.  No sensory deficit.  Well-perfused extremity.  Low  suspicion for compartment syndrome Provided cam boot, crutches for stabilization, ensure no weightbearing on right foot until she follows up with podiatry this week.  Following application of cam boot I did reassess patient and sensation appears intact.  Extremity continues to be well-perfused Provided podiatry follow-up on DC paperwork so she can call tomorrow Patient has narcotic pain medicine at home already for other pain and does not need additional pain relief Discussed after symptomatic treatment at home to include NWB, rest, ice   Reevaluation:  After the interventions noted above, I reevaluated the patient and found that they have :stayed the same   Social Determinants of Health:  Former smoker   Dispostion:  After consideration of the diagnostic results and the patients response to treatment, I feel that the patent would benefit from management with podiatry follow-up.   Discussed ED workup, disposition, return to ED precautions with patient who expresses understanding agrees with plan.  All questions  answered to their satisfaction.  They are agreeable to plan.  Discharge instructions provided on paperwork  Final diagnoses:  Closed displaced fracture of third metatarsal bone of right foot, initial encounter  Displaced fracture of fourth metatarsal bone, right foot, initial encounter for closed fracture    ED Discharge Orders     None        Minnie Tinnie BRAVO, PA 06/05/24 1907    Mannie Pac T, DO 06/11/24 [phone]

## 2024-06-06 DIAGNOSIS — S92331A Displaced fracture of third metatarsal bone, right foot, initial encounter for closed fracture: Secondary | ICD-10-CM | POA: Diagnosis not present

## 2024-06-06 NOTE — Progress Notes (Deleted)
 Cardiology Office Note:    Date:  06/06/2024   ID:  Leslie Dorsey, DOB Sep 15, 1950, MRN [id number]  PCP:  Kip Righter, MD   [facility] Providers Cardiologist:  Darryle ONEIDA Decent, MD { Click to update primary MD,subspecialty MD or APP then REFRESH:1}    Referring MD: Kip Righter, MD   No chief complaint on file. ***  History of Present Illness:    Leslie Dorsey is a 73 y.o. female with a hx of CAD, hypertension, tobacco abuse.  She suffered a STEMI 08/2015 resulting in PCI/DES to proximal RCA. Previously followed with Dr. Burnard.   She was last seen by Dr. Decent in 2022 following an ER visit for chest pain.  She ruled out with negative cardiac enzymes and a nonischemic EKG.  She declined stress test at outpatient visit.  At that time she was likely still recovering from bronchitis x 3 weeks, chest pain was pleuritic, felt related to MSK.  She was seen in the ER 06/05/2024 after a fall resulting in metatarsal fracture.  She reports walking to the couch and turned around suddenly and rolled her ankle.  No loss of consciousness.    CAD History of STEMI 2016 DES-RCA - Remains on 81 mg aspirin , Plavix , 2.5 mg amlodipine , 25 mg HCTZ, 10 mg Crestor , 10 mg Zetia  -Given recent falls, will consider discontinuing DAPT, continue with aspirin  monotherapy   Hypertension - Continue amlodipine  and hydrochlorothiazide    Hyperlipidemia with LDL goal less than 70 - Continue 10 mg Crestor  and 10 mg Zetia  - No recent lipid panel in our system, who follows this?      Past Medical History:  Diagnosis Date   CAD (coronary artery disease)    a. Inf STEMI 08/2015 - inferior STEMI 08/2015 s/p DES to RCA.   Essential hypertension    Hyperglycemia    a. 08/2015: A1C 5.8.   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 (HCC)    Tobacco abuse     Past Surgical History:  Procedure Laterality Date   ABDOMINAL  HYSTERECTOMY     CARDIAC CATHETERIZATION N/A 08/26/2015   Procedure: Left Heart Cath and Coronary Angiography;  Surgeon: Debby DELENA Burnard, MD;  Location: [facility] INVASIVE CV LAB;  Service: Cardiovascular;  Laterality: N/A;   CARDIAC CATHETERIZATION N/A 08/26/2015   Procedure: Coronary Stent Intervention;  Surgeon: Debby DELENA Burnard, MD;  Location: [facility] INVASIVE CV LAB;  Service: Cardiovascular;  Laterality: N/A;   HEMORROIDECTOMY      Current Medications: No outpatient medications have been marked as taking for the 06/11/24 encounter (Appointment) with Madie Jon Garre, PA.     Allergies:   Brilinta  [ticagrelor ], Levofloxacin, Morphine and codeine, Shellfish-derived products, Gabapentin, Lisinopril , Solu-medrol  [methylprednisolone ], and Lipitor  [atorvastatin ]   Social History   Socioeconomic History   Marital status: Married    Spouse name: Not on file   Number of children: Not on file   Years of education: Not on file   Highest education level: Not on file  Occupational History   Not on file  Tobacco Use   Smoking status: Former    Current packs/day: 0.00    Types: Cigarettes    Quit date: 08/26/2015    Years since quitting: 8.7   Smokeless tobacco: Never  Substance and Sexual Activity   Alcohol use: No   Drug use: No   Sexual activity: Not on file  Other Topics Concern   Not on  file  Social History Narrative   Not on file   Social Drivers of Health   Financial Resource Strain: Not on file  Food Insecurity: No Food Insecurity (03/23/2023)   Hunger Vital Sign    Worried About Running Out of Food in the Last Year: Never true    Ran Out of Food in the Last Year: Never true  Transportation Needs: No Transportation Needs (03/23/2023)   PRAPARE - Administrator, Civil Service (Medical): No    Lack of Transportation (Non-Medical): No  Physical Activity: Not on file  Stress: Not on file  Social Connections: Not on file     Family History: The patient's ***family  history includes Heart attack in her mother; Heart disease in her mother.  ROS:   Please see the history of present illness.    *** All other systems reviewed and are negative.  EKGs/Labs/Other Studies Reviewed:    The following studies were reviewed today: ***      Recent Labs: No results found for requested labs within last 365 days.  Recent Lipid Panel    Component Value Date/Time   CHOL 208 (H) 12/07/2016 1133   TRIG 120 12/07/2016 1133   HDL 56 12/07/2016 1133   CHOLHDL 3.7 12/07/2016 1133   VLDL 24 12/07/2016 1133   LDLCALC 128 (H) 12/07/2016 1133     Risk Assessment/Calculations:   {Does this patient have ATRIAL FIBRILLATION?[phone]}            Physical Exam:    VS:  There were no vitals taken for this visit.    Wt Readings from Last 3 Encounters:  06/05/24 163 lb (73.9 kg)  03/23/23 171 lb (77.6 kg)  07/22/21 174 lb (78.9 kg)     GEN: *** Well nourished, well developed in no acute distress HEENT: Normal NECK: No JVD; No carotid bruits LYMPHATICS: No lymphadenopathy CARDIAC: ***RRR, no murmurs, rubs, gallops RESPIRATORY:  Clear to auscultation without rales, wheezing or rhonchi  ABDOMEN: Soft, non-tender, non-distended MUSCULOSKELETAL:  No edema; No deformity  SKIN: Warm and dry NEUROLOGIC:  Alert and oriented x 3 PSYCHIATRIC:  Normal affect   ASSESSMENT:    No diagnosis found. PLAN:    In order of problems listed above:  ***      {Are you ordering a CV Procedure (e.g. stress test, cath, DCCV, TEE, etc)?   Press F2        :[id number]}    Medication Adjustments/Labs and Tests Ordered: Current medicines are reviewed at length with the patient today.  Concerns regarding medicines are outlined above.  No orders of the defined types were placed in this encounter.  No orders of the defined types were placed in this encounter.   There are no Patient Instructions on file for this visit.   Signed, Jon Garre Shia Eber, PA  06/06/2024 1:52  PM    [facility]

## 2024-06-11 ENCOUNTER — Ambulatory Visit: Admitting: Physician Assistant

## 2024-06-14 ENCOUNTER — Ambulatory Visit: Admitting: Podiatry

## 2024-06-14 DIAGNOSIS — S92334A Nondisplaced fracture of third metatarsal bone, right foot, initial encounter for closed fracture: Secondary | ICD-10-CM | POA: Diagnosis not present

## 2024-06-14 NOTE — Progress Notes (Signed)
 Subjective:  Patient ID: Leslie Dorsey, female    DOB: [birth year],  MRN: [id number]  Chief Complaint  Patient presents with   Foot Pain    Closed displaced fracture of third metatarsal bone of right foot. 8 pain walking. Wearing pneumatic cast. Non diabetic. Pt. Clemens 06/05/24. Discoloration distal 2nd toe.    73 y.o. female presents for evaluation of right foot metatarsal fracture.  Patient went to the emergency department on June 05, 2024 for right foot pain after a fall.  She was walking to a couch and turned around suddenly rolled her ankle.  She has pain with weightbearing.  X-ray was obtained in the ED that showed mild displaced fracture of the 3rd and 4th metatarsal neck area.  Patient was provided with cam boot crutches and nonweightbearing.  Sensation and perfusion were assessed to be intact at discharge.  Past Medical History:  Diagnosis Date   CAD (coronary artery disease)    a. Inf STEMI 08/2015 - inferior STEMI 08/2015 s/p DES to RCA.   Essential hypertension    Hyperglycemia    a. 08/2015: A1C 5.8.   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 (HCC)    Tobacco abuse     Allergies  Allergen Reactions   Brilinta  [Ticagrelor ] Shortness Of Breath and Other (See Comments)    Difficulty breathing    Levofloxacin Anaphylaxis, Hives and Rash   Morphine And Codeine Anaphylaxis   Shellfish Protein-Containing Drug Products Shortness Of Breath and Other (See Comments)    Crabmeat- Made me feel like I was dying   Gabapentin Other (See Comments)    Does not care to take this   Lisinopril  Hives   Solu-Medrol  [Methylprednisolone ] Other (See Comments)    Acute confusion, agitation   Lipitor  [Atorvastatin ] Other (See Comments)    Myalgia     ROS: Negative except as per HPI above  Objective:  General: AAO x3, NAD  Dermatological: Ecchymosis of the right foot with no  Vascular:  Dorsalis Pedis artery  and Posterior Tibial artery pedal pulses are 2/4 bilateral.  Capillary fill time < 3 sec to all digits.   Neruologic: Grossly intact via light touch bilateral. Protective threshold intact to all sites bilateral.   Musculoskeletal: Pain and swelling of the right forefoot  Gait: Unassisted, Nonantalgic.   No images are attached to the encounter.  Radiographs:  Date: 06/05/2024 XR the right foot non Weightbearing AP/Lateral/Oblique   Findings: Mildly displaced fractures of the third and fourth metatarsal head/necks.  Minimal rotation noted of the 3rd and 4th met head articular surface Assessment:   1. Closed nondisplaced fracture of third metatarsal bone of right foot, initial encounter      Plan:  Patient was evaluated and treated and all questions answered.  # Closed minimally displaced fracture of the 3rd and 4th metatarsal neck on the right foot - Discussed with patient that based on the fracture pattern on x-ray there is no surgery indicated we will proceed with conservative management - Continue with weightbearing as tolerated in cam boot minimize weightbearing as able - Oxycodone  and anti-inflammatories as needed for pain control - Ice and elevation as well as Ace wrap was applied at this visit - Will have patient back in 5 weeks for reevaluation with x-ray to ensure healing and feeling decreased pain at that time         Leslie Dorsey, DPM [facility]

## 2024-06-27 DIAGNOSIS — G894 Chronic pain syndrome: Secondary | ICD-10-CM | POA: Diagnosis not present

## 2024-06-27 DIAGNOSIS — M48061 Spinal stenosis, lumbar region without neurogenic claudication: Secondary | ICD-10-CM | POA: Diagnosis not present

## 2024-06-27 DIAGNOSIS — M47816 Spondylosis without myelopathy or radiculopathy, lumbar region: Secondary | ICD-10-CM | POA: Diagnosis not present

## 2024-06-27 DIAGNOSIS — M5416 Radiculopathy, lumbar region: Secondary | ICD-10-CM | POA: Diagnosis not present

## 2024-07-04 NOTE — Progress Notes (Addendum)
 Tomeka Kantner                                          MRN: [id number]   07/25/2024   The [facility] Quality Team Specialist reviewed this patient medical record for the purposes of chart review for care gap closure. The following were reviewed: chart review for care gap closure-breast cancer screening and colorectal cancer screening.    [facility]I Quality Team

## 2024-07-05 ENCOUNTER — Ambulatory Visit: Admitting: Physician Assistant

## 2024-07-05 NOTE — Progress Notes (Deleted)
 Cardiology Office Note:    Date:  07/05/2024   ID:  Toma Erichsen, DOB 09/08/1950, MRN [id number]  PCP:  Kip Righter, MD   [facility] Providers Cardiologist:  Darryle ONEIDA Decent, MD { Click to update primary MD,subspecialty MD or APP then REFRESH:1}    Referring MD: Kip Righter, MD   No chief complaint on file. ***  History of Present Illness:    Leslie Dorsey is a 73 y.o. female with a hx of CAD, hypertension, tobacco abuse.  She suffered a STEMI 08/2015 resulting in PCI/DES to proximal RCA. Previously followed with Dr. Burnard.   She was last seen by Dr. Decent in 2022 following an ER visit for chest pain.  She ruled out with negative cardiac enzymes and a nonischemic EKG.  She declined stress test at outpatient visit.  At that time she was likely still recovering from bronchitis x 3 weeks, chest pain was pleuritic, felt related to MSK.  She was seen in the ER 06/05/2024 after a fall resulting in metatarsal fracture.  She reports walking to the couch and turned around suddenly and rolled her ankle.  No loss of consciousness.    CAD History of STEMI 2016 DES-RCA - Remains on 81 mg aspirin , Plavix , 2.5 mg amlodipine , 25 mg HCTZ, 10 mg Crestor , 10 mg Zetia  -Given recent falls, will consider discontinuing DAPT, continue with aspirin  monotherapy   Hypertension - Continue amlodipine  and hydrochlorothiazide    Hyperlipidemia with LDL goal less than 70 - Continue 10 mg Crestor  and 10 mg Zetia  - No recent lipid panel in our system, who follows this?   Past Medical History:  Diagnosis Date   CAD (coronary artery disease)    a. Inf STEMI 08/2015 - inferior STEMI 08/2015 s/p DES to RCA.   Essential hypertension    Hyperglycemia    a. 08/2015: A1C 5.8.   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 (HCC)    Tobacco abuse     Past Surgical History:  Procedure Laterality Date   ABDOMINAL  HYSTERECTOMY     CARDIAC CATHETERIZATION N/A 08/26/2015   Procedure: Left Heart Cath and Coronary Angiography;  Surgeon: Debby DELENA Burnard, MD;  Location: [facility] INVASIVE CV LAB;  Service: Cardiovascular;  Laterality: N/A;   CARDIAC CATHETERIZATION N/A 08/26/2015   Procedure: Coronary Stent Intervention;  Surgeon: Debby DELENA Burnard, MD;  Location: [facility] INVASIVE CV LAB;  Service: Cardiovascular;  Laterality: N/A;   HEMORROIDECTOMY      Current Medications: No outpatient medications have been marked as taking for the 07/05/24 encounter (Appointment) with Madie Jon Garre, PA.     Allergies:   Brilinta  [ticagrelor ], Levofloxacin, Morphine and codeine, Shellfish protein-containing drug products, Gabapentin, Lisinopril , Solu-medrol  [methylprednisolone ], and Lipitor  [atorvastatin ]   Social History   Socioeconomic History   Marital status: Married    Spouse name: Not on file   Number of children: Not on file   Years of education: Not on file   Highest education level: Not on file  Occupational History   Not on file  Tobacco Use   Smoking status: Former    Current packs/day: 0.00    Types: Cigarettes    Quit date: 08/26/2015    Years since quitting: 8.8   Smokeless tobacco: Never  Substance and Sexual Activity   Alcohol use: No   Drug use: No   Sexual activity: Not on file  Other Topics Concern   Not on file  Social History Narrative   Not on file   Social Drivers of Health   Financial Resource Strain: Not on file  Food Insecurity: No Food Insecurity (03/23/2023)   Hunger Vital Sign    Worried About Running Out of Food in the Last Year: Never true    Ran Out of Food in the Last Year: Never true  Transportation Needs: No Transportation Needs (03/23/2023)   PRAPARE - Administrator, Civil Service (Medical): No    Lack of Transportation (Non-Medical): No  Physical Activity: Not on file  Stress: Not on file  Social Connections: Not on file     Family History: The  patient's ***family history includes Heart attack in her mother; Heart disease in her mother.  ROS:   Please see the history of present illness.    *** All other systems reviewed and are negative.  EKGs/Labs/Other Studies Reviewed:    The following studies were reviewed today: ***      Recent Labs: No results found for requested labs within last 365 days.  Recent Lipid Panel    Component Value Date/Time   CHOL 208 (H) 12/07/2016 1133   TRIG 120 12/07/2016 1133   HDL 56 12/07/2016 1133   CHOLHDL 3.7 12/07/2016 1133   VLDL 24 12/07/2016 1133   LDLCALC 128 (H) 12/07/2016 1133     Risk Assessment/Calculations:   {Does this patient have ATRIAL FIBRILLATION?[phone]}  No BP recorded.  {Refresh Note OR Click here to enter BP  :1}***         Physical Exam:    VS:  There were no vitals taken for this visit.    Wt Readings from Last 3 Encounters:  06/05/24 163 lb (73.9 kg)  03/23/23 171 lb (77.6 kg)  07/22/21 174 lb (78.9 kg)     GEN: *** Well nourished, well developed in no acute distress HEENT: Normal NECK: No JVD; No carotid bruits LYMPHATICS: No lymphadenopathy CARDIAC: ***RRR, no murmurs, rubs, gallops RESPIRATORY:  Clear to auscultation without rales, wheezing or rhonchi  ABDOMEN: Soft, non-tender, non-distended MUSCULOSKELETAL:  No edema; No deformity  SKIN: Warm and dry NEUROLOGIC:  Alert and oriented x 3 PSYCHIATRIC:  Normal affect   ASSESSMENT:    No diagnosis found. PLAN:    In order of problems listed above:  ***      {Are you ordering a CV Procedure (e.g. stress test, cath, DCCV, TEE, etc)?   Press F2        :[id number]}    Medication Adjustments/Labs and Tests Ordered: Current medicines are reviewed at length with the patient today.  Concerns regarding medicines are outlined above.  No orders of the defined types were placed in this encounter.  No orders of the defined types were placed in this encounter.   There are no Patient  Instructions on file for this visit.   Signed, Jon Nat Hails, PA  07/05/2024 12:00 PM    [facility]

## 2024-07-12 NOTE — Progress Notes (Deleted)
  Cardiology Office Note:  .   Date:  07/12/2024  ID:  Leslie Dorsey, DOB April 25, 1951, MRN [id number] PCP: Kip Righter, MD  [facility] Providers Cardiologist:  Darryle ONEIDA Decent, MD { Click to update primary MD,subspecialty MD or APP then REFRESH:1}   History of Present Illness: .   No chief complaint on file.   Leslie Dorsey is a 73 y.o. female with history of CAD, HTN, tobacco abuse who presents for follow-up.      Problem List CAD -STEMI 08/2015 -PCI to pRCA 2. HTN 3. Tobacco abuse     ROS: All other ROS reviewed and negative. Pertinent positives noted in the HPI.     Studies Reviewed: SABRA       TTE 12/23/2016 - Left ventricle: The cavity size was normal. There was mild focal    basal hypertrophy of the septum. Systolic function was vigorous.    The estimated ejection fraction was in the range of 65% to 70%.    Wall motion was normal; there were no regional wall motion    abnormalities. Doppler parameters are consistent with abnormal    left ventricular relaxation (grade 1 diastolic dysfunction).  Physical Exam:   VS:  There were no vitals taken for this visit.   Wt Readings from Last 3 Encounters:  06/05/24 163 lb (73.9 kg)  03/23/23 171 lb (77.6 kg)  07/22/21 174 lb (78.9 kg)    GEN: Well nourished, well developed in no acute distress NECK: No JVD; No carotid bruits CARDIAC: ***RRR, no murmurs, rubs, gallops RESPIRATORY:  Clear to auscultation without rales, wheezing or rhonchi  ABDOMEN: Soft, non-tender, non-distended EXTREMITIES:  No edema; No deformity  ASSESSMENT AND PLAN: .   ***    {Are you ordering a CV Procedure (e.g. stress test, cath, DCCV, TEE, etc)?   Press F2        :[id number]}   Follow-up: No follow-ups on file.  Time Spent with Patient: I have spent a total of *** minutes caring for this patient today face to face, ordering and reviewing labs/tests, reviewing prior records/medical history, examining the patient, establishing  an assessment and plan, communicating results/findings to the patient/family, and documenting in the medical record.   Signed, Darryle ONEIDA. Decent, MD, [facility]  [facility]  [address] [place], [place] [address] [phone]  12:59 PM

## 2024-07-13 ENCOUNTER — Ambulatory Visit: Attending: Cardiovascular Disease | Admitting: Cardiovascular Disease

## 2024-07-13 ENCOUNTER — Encounter: Payer: Self-pay | Admitting: Cardiovascular Disease

## 2024-07-13 DIAGNOSIS — I2510 Atherosclerotic heart disease of native coronary artery without angina pectoris: Secondary | ICD-10-CM

## 2024-07-13 DIAGNOSIS — E782 Mixed hyperlipidemia: Secondary | ICD-10-CM

## 2024-07-19 ENCOUNTER — Ambulatory Visit: Admitting: Podiatry

## 2024-07-24 ENCOUNTER — Ambulatory Visit

## 2024-07-24 ENCOUNTER — Ambulatory Visit: Admitting: Podiatry

## 2024-07-24 DIAGNOSIS — S92334D Nondisplaced fracture of third metatarsal bone, right foot, subsequent encounter for fracture with routine healing: Secondary | ICD-10-CM

## 2024-07-24 DIAGNOSIS — Z91199 Patient's noncompliance with other medical treatment and regimen due to unspecified reason: Secondary | ICD-10-CM

## 2024-07-24 NOTE — Progress Notes (Signed)
 NS

## 2024-07-24 NOTE — Addendum Note (Signed)
 Addended by: GERRIT ANDREZ CROME on: 07/24/2024 01:37 PM   Modules accepted: Orders
